# Patient Record
Sex: Male | Born: 1955 | Race: White | Hispanic: No | Marital: Married | Smoking: Never smoker
Health system: Southern US, Community
[De-identification: ages and names within clinical notes are randomized; demographics above are authoritative.]

## PROBLEM LIST (undated history)

## (undated) DIAGNOSIS — I2699 Other pulmonary embolism without acute cor pulmonale: Secondary | ICD-10-CM

## (undated) DIAGNOSIS — F1090 Alcohol use, unspecified, uncomplicated: Secondary | ICD-10-CM

## (undated) DIAGNOSIS — C9110 Chronic lymphocytic leukemia of B-cell type not having achieved remission: Secondary | ICD-10-CM

## (undated) DIAGNOSIS — I82409 Acute embolism and thrombosis of unspecified deep veins of unspecified lower extremity: Secondary | ICD-10-CM

## (undated) DIAGNOSIS — Z7289 Other problems related to lifestyle: Secondary | ICD-10-CM

## (undated) DIAGNOSIS — S83209A Unspecified tear of unspecified meniscus, current injury, unspecified knee, initial encounter: Secondary | ICD-10-CM

## (undated) DIAGNOSIS — I5022 Chronic systolic (congestive) heart failure: Secondary | ICD-10-CM

## (undated) DIAGNOSIS — I493 Ventricular premature depolarization: Secondary | ICD-10-CM

## (undated) DIAGNOSIS — I429 Cardiomyopathy, unspecified: Secondary | ICD-10-CM

## (undated) HISTORY — PX: OTHER SURGICAL HISTORY: SHX169

## (undated) HISTORY — PX: TONSILLECTOMY: SUR1361

## (undated) HISTORY — DX: Chronic systolic (congestive) heart failure: I50.22

---

## 1998-09-14 ENCOUNTER — Ambulatory Visit (HOSPITAL_BASED_OUTPATIENT_CLINIC_OR_DEPARTMENT_OTHER): Admission: RE | Admit: 1998-09-14 | Discharge: 1998-09-14 | Payer: Self-pay | Admitting: Surgery

## 2005-06-10 ENCOUNTER — Ambulatory Visit (HOSPITAL_BASED_OUTPATIENT_CLINIC_OR_DEPARTMENT_OTHER): Admission: RE | Admit: 2005-06-10 | Discharge: 2005-06-10 | Payer: Self-pay | Admitting: Surgery

## 2005-06-10 HISTORY — PX: OTHER SURGICAL HISTORY: SHX169

## 2008-02-21 ENCOUNTER — Encounter: Admission: RE | Admit: 2008-02-21 | Discharge: 2008-02-21 | Payer: Self-pay | Admitting: Surgery

## 2010-06-21 ENCOUNTER — Encounter: Payer: Self-pay | Admitting: Surgery

## 2010-10-15 NOTE — Op Note (Signed)
[name], [name]             ACCOUNT NO.:  [id number]   MEDICAL RECORD NO.:  [id number]          PATIENT TYPE:  AMB   LOCATION:  DSC                          FACILITY:  [facility]   PHYSICIAN:  Thornton Park. Daphine Deutscher, MD  DATE OF BIRTH:  26-Sep-1955   DATE OF PROCEDURE:  06/10/2005  DATE OF DISCHARGE:                                 OPERATIVE REPORT   PREOPERATIVE DIAGNOSIS:  Left inguinal hernia.   POSTOPERATIVE DIAGNOSIS:  Left indirect sliding hernia containing sigmoid  colon.   PROCEDURE:  Left inguinal herniorrhaphy with Ethicon hernia system mesh  patch.   SURGEON:  Thornton Park. Daphine Deutscher, MD   ANESTHESIA:  General by LMA, converted to endotracheal.   DESCRIPTION OF PROCEDURE:  Ross [name] is a 55 year old man taken to  room #6 and given general. The inguinal region was clipped and prepped with  Betadine and draped sterilely. A hash Camara was made in the skin for  subsequent skin closure, and a small oblique incision was made in the left  inguinal region. This was carried down to a very thick panniculus, about an  inch and a half in depth, to the external obliques -- which were then  exposed and incised along the fibers, exposing the cord. Owing to the depth  and narrowness of his pelvis, I was able to mobilize the cord with some  difficulty; put a Penrose around it and then dissected proximally where it  exited the internal ring. There was a massive amount of fat there, and I  separated fat hernia from a large fat hernia that appeared to contain a sac.  There was also a sac more medially, which I opened and that was limited.  However, the mid large hernia as I opened contained, on one wall, the  sigmoid colon. I did not injure that, but recognized it and then dissected  this entire sac free.  I then pushed it back up inside the ring. I used a  Ray-Tec sponge to dissect around the ring, and then I used Ethicon hernia  mesh system and cut the posterior portion significantly;  giving lateral  coverage but less medial coverage to prevent the bulking effect.  I then  placed it in the internal ring, and sutured it in three places with 0  Prolene to the inguinal ligament laterally and then medially. This was then  on the outside cut to go around the cord, and sutured medially to the  inguinal ligament-pubis junction, and then it was sutured to itself as it  came around the cord structures. There was adequate room for the cord  structures. It was then tucked beneath the external oblique laterally, and  the external oblique was closed over this. The area was injected with 0.5%  Marcaine. There was no bleeding. Vicryl 4-0 was used in the subcutaneous  tissue and then subcuticularly, and the skin was closed with Dermabond. The  patient was given Percocet 7.5/500 to take for pain.  He will be followed up  in the office in 2-3 weeks.      Thornton Park Daphine Deutscher, MD  Electronically Signed  MBM/MEDQ  D:  06/10/2005  T:  06/11/2005  Job:  [id number]

## 2011-05-18 ENCOUNTER — Other Ambulatory Visit: Payer: Self-pay | Admitting: Dermatology

## 2011-06-29 ENCOUNTER — Other Ambulatory Visit: Payer: Self-pay | Admitting: Dermatology

## 2011-07-19 ENCOUNTER — Other Ambulatory Visit: Payer: Self-pay | Admitting: Orthopedic Surgery

## 2011-07-19 ENCOUNTER — Encounter (HOSPITAL_BASED_OUTPATIENT_CLINIC_OR_DEPARTMENT_OTHER): Payer: Self-pay | Admitting: *Deleted

## 2011-07-19 NOTE — Progress Notes (Signed)
NPO AFTER MN. ARRIVES AT 1000. NEEDS HG AND EKG. 

## 2011-07-22 ENCOUNTER — Encounter (HOSPITAL_BASED_OUTPATIENT_CLINIC_OR_DEPARTMENT_OTHER)
Admission: RE | Disposition: A | Discharge status: Home / Self Care | Payer: Self-pay | Source: Ambulatory Visit | Attending: Orthopedic Surgery

## 2011-07-22 ENCOUNTER — Encounter (HOSPITAL_BASED_OUTPATIENT_CLINIC_OR_DEPARTMENT_OTHER): Payer: Self-pay | Admitting: *Deleted

## 2011-07-22 ENCOUNTER — Encounter (HOSPITAL_BASED_OUTPATIENT_CLINIC_OR_DEPARTMENT_OTHER): Payer: Self-pay | Admitting: Anesthesiology

## 2011-07-22 ENCOUNTER — Other Ambulatory Visit: Payer: Self-pay

## 2011-07-22 ENCOUNTER — Ambulatory Visit (HOSPITAL_BASED_OUTPATIENT_CLINIC_OR_DEPARTMENT_OTHER)
Admission: RE | Admit: 2011-07-22 | Discharge: 2011-07-22 | Disposition: A | Discharge status: Home / Self Care | Payer: BC Managed Care – PPO | Source: Ambulatory Visit | Attending: Orthopedic Surgery | Admitting: Orthopedic Surgery

## 2011-07-22 ENCOUNTER — Ambulatory Visit (HOSPITAL_BASED_OUTPATIENT_CLINIC_OR_DEPARTMENT_OTHER): Payer: BC Managed Care – PPO | Admitting: Anesthesiology

## 2011-07-22 DIAGNOSIS — M2240 Chondromalacia patellae, unspecified knee: Secondary | ICD-10-CM | POA: Insufficient documentation

## 2011-07-22 DIAGNOSIS — X58XXXA Exposure to other specified factors, initial encounter: Secondary | ICD-10-CM | POA: Insufficient documentation

## 2011-07-22 DIAGNOSIS — IMO0002 Reserved for concepts with insufficient information to code with codable children: Secondary | ICD-10-CM | POA: Insufficient documentation

## 2011-07-22 DIAGNOSIS — Z9889 Other specified postprocedural states: Secondary | ICD-10-CM

## 2011-07-22 HISTORY — PX: KNEE ARTHROSCOPY: SHX127

## 2011-07-22 HISTORY — DX: Unspecified tear of unspecified meniscus, current injury, unspecified knee, initial encounter: S83.209A

## 2011-07-22 SURGERY — ARTHROSCOPY, KNEE
Anesthesia: General | Site: Knee | Laterality: Left | Wound class: Clean

## 2011-07-22 MED ORDER — METHOCARBAMOL 500 MG PO TABS: 500.0000 mg | ORAL_TABLET | Freq: Four times a day (QID) | ORAL | Status: AC

## 2011-07-22 MED ORDER — LACTATED RINGERS IV SOLN
INTRAVENOUS | Status: DC | PRN
  Administered 2011-07-22 (×2): via INTRAVENOUS

## 2011-07-22 MED ORDER — POVIDONE-IODINE 7.5 % EX SOLN: Freq: Once | CUTANEOUS | Status: DC

## 2011-07-22 MED ORDER — FENTANYL CITRATE 0.05 MG/ML IJ SOLN
INTRAMUSCULAR | Status: DC | PRN
  Administered 2011-07-22: 100 ug via INTRAVENOUS
  Administered 2011-07-22 (×4): 50 ug via INTRAVENOUS

## 2011-07-22 MED ORDER — OXYCODONE-ACETAMINOPHEN 7.5-325 MG PO TABS: 1.0000 | ORAL_TABLET | ORAL | Status: AC | PRN

## 2011-07-22 MED ORDER — LIDOCAINE HCL (CARDIAC) 20 MG/ML IV SOLN
INTRAVENOUS | Status: DC | PRN
  Administered 2011-07-22: 100 mg via INTRAVENOUS

## 2011-07-22 MED ORDER — KETOROLAC TROMETHAMINE 30 MG/ML IJ SOLN: 15.0000 mg | Freq: Once | INTRAMUSCULAR | Status: DC | PRN

## 2011-07-22 MED ORDER — SODIUM CHLORIDE 0.9 % IR SOLN
Status: DC | PRN
  Administered 2011-07-22: 13:00:00

## 2011-07-22 MED ORDER — ONDANSETRON HCL 4 MG/2ML IJ SOLN
INTRAMUSCULAR | Status: DC | PRN
  Administered 2011-07-22: 4 mg via INTRAVENOUS

## 2011-07-22 MED ORDER — FENTANYL CITRATE 0.05 MG/ML IJ SOLN: 25.0000 ug | INTRAMUSCULAR | Status: DC | PRN

## 2011-07-22 MED ORDER — PROPOFOL 10 MG/ML IV EMUL
INTRAVENOUS | Status: DC | PRN
  Administered 2011-07-22: 350 mg via INTRAVENOUS
  Administered 2011-07-22: 50 mg via INTRAVENOUS

## 2011-07-22 MED ORDER — MIDAZOLAM HCL 5 MG/5ML IJ SOLN
INTRAMUSCULAR | Status: DC | PRN
  Administered 2011-07-22: 2 mg via INTRAVENOUS

## 2011-07-22 MED ORDER — DEXAMETHASONE SODIUM PHOSPHATE 4 MG/ML IJ SOLN
INTRAMUSCULAR | Status: DC | PRN
  Administered 2011-07-22: 10 mg via INTRAVENOUS

## 2011-07-22 MED ORDER — PROMETHAZINE HCL 25 MG/ML IJ SOLN: 6.2500 mg | INTRAMUSCULAR | Status: DC | PRN

## 2011-07-22 MED ORDER — SODIUM CHLORIDE 0.9 % IR SOLN
Status: DC | PRN
  Administered 2011-07-22: 6000 mL

## 2011-07-22 SURGICAL SUPPLY — 52 items
BANDAGE ELASTIC 6 VELCRO ST LF (GAUZE/BANDAGES/DRESSINGS) ×3 IMPLANT
BANDAGE ESMARK 6X9 LF (GAUZE/BANDAGES/DRESSINGS) ×1 IMPLANT
BANDAGE GAUZE ELAST BULKY 4 IN (GAUZE/BANDAGES/DRESSINGS) ×2 IMPLANT
BLADE 4.2CUDA (BLADE) IMPLANT
BLADE CUDA 5.5 (BLADE) IMPLANT
BLADE CUDA SHAVER 3.5 (BLADE) ×2 IMPLANT
BLADE CUTTER GATOR 3.5 (BLADE) IMPLANT
BLADE GREAT WHITE 4.2 (BLADE) IMPLANT
BNDG CMPR 9X6 STRL LF SNTH (GAUZE/BANDAGES/DRESSINGS) ×1
BNDG ESMARK 6X9 LF (GAUZE/BANDAGES/DRESSINGS) ×2
CANISTER SUCT LVC 12 LTR MEDI- (MISCELLANEOUS) ×3 IMPLANT
CANISTER SUCTION 1200CC (MISCELLANEOUS) ×2 IMPLANT
CLOTH BEACON ORANGE TIMEOUT ST (SAFETY) ×2 IMPLANT
DRAPE ARTHROSCOPY W/POUCH 114 (DRAPES) ×2 IMPLANT
DRAPE LG THREE QUARTER DISP (DRAPES) ×2 IMPLANT
DRSG EMULSION OIL 3X3 NADH (GAUZE/BANDAGES/DRESSINGS) ×2 IMPLANT
DURAPREP 26ML APPLICATOR (WOUND CARE) ×2 IMPLANT
ELECT MENISCUS 165MM 90D (ELECTRODE) IMPLANT
ELECT REM PT RETURN 9FT ADLT (ELECTROSURGICAL)
ELECTRODE REM PT RTRN 9FT ADLT (ELECTROSURGICAL) IMPLANT
GAUZE SPONGE 4X4 12PLY STRL LF (GAUZE/BANDAGES/DRESSINGS) ×1 IMPLANT
GLOVE BIOGEL PI IND STRL 6.5 (GLOVE) IMPLANT
GLOVE BIOGEL PI IND STRL 7.0 (GLOVE) IMPLANT
GLOVE BIOGEL PI INDICATOR 6.5 (GLOVE) ×3
GLOVE BIOGEL PI INDICATOR 7.0 (GLOVE) ×2
GLOVE ECLIPSE 7.0 STRL STRAW (GLOVE) ×1 IMPLANT
GLOVE ECLIPSE 8.0 STRL XLNG CF (GLOVE) ×3 IMPLANT
GLOVE INDICATOR 8.0 STRL GRN (GLOVE) ×2 IMPLANT
GOWN PREVENTION PLUS LG XLONG (DISPOSABLE) ×3 IMPLANT
GOWN STRL REIN XL XLG (GOWN DISPOSABLE) ×2 IMPLANT
IV NS IRRIG 3000ML ARTHROMATIC (IV SOLUTION) ×6 IMPLANT
KNEE WRAP E Z 3 GEL PACK (MISCELLANEOUS) ×2 IMPLANT
NDL HYPO 18GX1.5 BLUNT FILL (NEEDLE) ×1 IMPLANT
NDL SAFETY ECLIPSE 18X1.5 (NEEDLE) ×1 IMPLANT
NEEDLE HYPO 18GX1.5 BLUNT FILL (NEEDLE) ×2 IMPLANT
NEEDLE HYPO 18GX1.5 SHARP (NEEDLE) ×2
PACK ARTHROSCOPY DSU (CUSTOM PROCEDURE TRAY) ×2 IMPLANT
PACK BASIN DAY SURGERY FS (CUSTOM PROCEDURE TRAY) ×2 IMPLANT
PADDING CAST ABS 4INX4YD NS (CAST SUPPLIES) ×1
PADDING CAST ABS COTTON 4X4 ST (CAST SUPPLIES) ×1 IMPLANT
PENCIL BUTTON HOLSTER BLD 10FT (ELECTRODE) IMPLANT
SET ARTHROSCOPY TUBING (MISCELLANEOUS) ×2
SET ARTHROSCOPY TUBING LN (MISCELLANEOUS) ×1 IMPLANT
SPONGE GAUZE 4X4 12PLY (GAUZE/BANDAGES/DRESSINGS) ×2 IMPLANT
SUT ETHIBOND 2 OS 4 DA (SUTURE) IMPLANT
SUT ETHILON 4 0 PS 2 18 (SUTURE) ×2 IMPLANT
SUT VIC AB 0 CT1 36 (SUTURE) IMPLANT
SUT VIC AB 2-0 PS2 27 (SUTURE) IMPLANT
SYRINGE 10CC LL (SYRINGE) ×2 IMPLANT
TOWEL OR 17X24 6PK STRL BLUE (TOWEL DISPOSABLE) ×2 IMPLANT
WAND 90 DEG TURBOVAC W/CORD (SURGICAL WAND) IMPLANT
WATER STERILE IRR 500ML POUR (IV SOLUTION) ×2 IMPLANT

## 2011-07-22 NOTE — Anesthesia Preprocedure Evaluation (Signed)
Anesthesia Evaluation  Patient identified by MRN, date of birth, ID band Patient awake    Reviewed: Allergy & Precautions, H&P , NPO status , Patient's Chart, lab work & pertinent test results  Airway Mallampati: II TM Distance: >3 FB Neck ROM: Full    Dental No notable dental hx.    Pulmonary neg pulmonary ROS,  clear to auscultation  Pulmonary exam normal       Cardiovascular neg cardio ROS Regular Normal    Neuro/Psych Negative Neurological ROS  Negative Psych ROS   GI/Hepatic negative GI ROS, Neg liver ROS,   Endo/Other  Negative Endocrine ROS  Renal/GU negative Renal ROS  Genitourinary negative   Musculoskeletal negative musculoskeletal ROS (+)   Abdominal   Peds negative pediatric ROS (+)  Hematology negative hematology ROS (+)   Anesthesia Other Findings   Reproductive/Obstetrics negative OB ROS                           Anesthesia Physical Anesthesia Plan  ASA: I  Anesthesia Plan: General   Post-op Pain Management:    Induction: Intravenous  Airway Management Planned: LMA  Additional Equipment:   Intra-op Plan:   Post-operative Plan:   Informed Consent: I have reviewed the patients History and Physical, chart, labs and discussed the procedure including the risks, benefits and alternatives for the proposed anesthesia with the patient or authorized representative who has indicated his/her understanding and acceptance.   Dental advisory given  Plan Discussed with: CRNA  Anesthesia Plan Comments:         Anesthesia Quick Evaluation  

## 2011-07-22 NOTE — Brief Op Note (Signed)
07/22/2011  1:36 PM  PATIENT:  Ross [name]  56 y.o. 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  PROCEDURE:  Procedure(s) (LRB): ARTHROSCOPY KNEE (Left)with partial medial meniscectomy and shaving of patella  SURGEON:  Surgeon(s) and Role:    * Drucilla Schmidt, MD - Primary  PHYSICIAN ASSISTANT:   ASSISTANTS: nurse   ANESTHESIA:   general  EBL:  Total I/O In: 1000 [I.V.:1000] Out: -   BLOOD ADMINISTERED:none  DRAINS: none   LOCAL MEDICATIONS USED:  NONE  SPECIMEN:  No Specimen  DISPOSITION OF SPECIMEN:  N/A  COUNTS:  YES  TOURNIQUET:   Total Tourniquet Time Documented: Thigh (Left) - 45 minutes  DICTATION: .Other Dictation: Dictation Number [id number]  PLAN OF CARE: Discharge to home after PACU  PATIENT DISPOSITION:  PACU - hemodynamically stable.   Delay start of Pharmacological VTE agent (>24hrs) due to surgical blood loss or risk of bleeding: not applicable

## 2011-07-22 NOTE — Transfer of Care (Signed)
Immediate Anesthesia Transfer of Care Note  Patient: Ross [name]  Procedure(s) Performed: Procedure(s) (LRB): ARTHROSCOPY KNEE (Left)  Patient Location: PACU  Anesthesia Type: General  Level of Consciousness: awake, alert  and oriented  Airway & Oxygen Therapy: Patient Spontanous Breathing and Patient connected to face mask oxygen  Post-op Assessment: Report given to PACU RN and Post -op Vital signs reviewed and stable  Post vital signs: Reviewed and stable  Complications: No apparent anesthesia complications

## 2011-07-22 NOTE — Anesthesia Procedure Notes (Signed)
Procedure Name: LMA Insertion Date/Time: 07/22/2011 12:38 PM Performed by: Huel Coventry Pre-anesthesia Checklist: Patient identified, Emergency Drugs available, Suction available and Patient being monitored Patient Re-evaluated:Patient Re-evaluated prior to inductionOxygen Delivery Method: Circle System Utilized Preoxygenation: Pre-oxygenation with 100% oxygen Intubation Type: IV induction Ventilation: Mask ventilation without difficulty LMA: LMA inserted LMA Size: 5.0 Number of attempts: 1 Airway Equipment and Method: bite block Placement Confirmation: positive ETCO2 Tube secured with: Tape Dental Injury: Teeth and Oropharynx as per pre-operative assessment

## 2011-07-22 NOTE — H&P (Signed)
Ross [name] is an 56 y.o. male.   Chief Complaint: painful lt knee HPI: mri demonstrates medial meniscus tear  Past Medical History  Diagnosis Date  . Acute meniscal tear of knee LEFT    Past Surgical History  Procedure Date  . Left inguinal hernia repair w/ mesh 06-10-2005  . Fatty cyst removed from flank and back     FLANK- 15 YRS AGO/  BACK 12 YRS AGO    History reviewed. No pertinent family history. Social History:  reports that he has never smoked. He has never used smokeless tobacco. He reports that he drinks about 3.6 ounces of alcohol per week. He reports that he does not use illicit drugs.  Allergies: No Known Allergies  Medications Prior to Admission  Medication Dose Route Frequency Provider Last Rate Last Dose  . povidone-iodine (BETADINE) 7.5 % scrub   Topical Once Drucilla Schmidt, MD       No current outpatient prescriptions on file as of 07/22/2011.    Results for orders placed during the hospital encounter of 07/22/11 (from the past 48 hour(s))  POCT HEMOGLOBIN-HEMACUE     Status: Normal   Collection Time   07/22/11 10:41 AM      Component Value Range Comment   Hemoglobin 14.0  13.0 - 17.0 (g/dL)    No results found.  ROS  Blood pressure 149/84, pulse 91, temperature 97.7 F (36.5 C), temperature source Oral, resp. rate 18, SpO2 96.00%. Physical Exam  Constitutional: He is oriented to person, place, and time. He appears well-developed and well-nourished.  HENT:  Head: Normocephalic and atraumatic.  Right Ear: External ear normal.  Left Ear: External ear normal.  Nose: Nose normal.  Mouth/Throat: Oropharynx is clear and moist.  Eyes: Conjunctivae and EOM are normal. Pupils are equal, round, and reactive to light.  Neck: Normal range of motion. Neck supple.  Cardiovascular: Normal rate, regular rhythm, normal heart sounds and intact distal pulses.   Respiratory: Effort normal and breath sounds normal.  GI: Soft. Bowel sounds are normal.    Musculoskeletal: Normal range of motion.  Neurological: He is alert and oriented to person, place, and time. He has normal reflexes.  Skin: Skin is warm and dry.  Psychiatric: He has a normal mood and affect. His behavior is normal. Judgment and thought content normal.     Assessment/Plan Torn medial meniscus lt knee Lt knee arthroscopy with partial medial meniscectomy  Ross [name] P 07/22/2011, 12:17 PM

## 2011-07-22 NOTE — Anesthesia Postprocedure Evaluation (Signed)
  Anesthesia Post-op Note  Patient: Ross [name]  Procedure(s) Performed: Procedure(s) (LRB): ARTHROSCOPY KNEE (Left)  Patient Location: PACU  Anesthesia Type: General  Level of Consciousness: awake and alert   Airway and Oxygen Therapy: Patient Spontanous Breathing  Post-op Pain: mild  Post-op Assessment: Post-op Vital signs reviewed, Patient's Cardiovascular Status Stable, Respiratory Function Stable, Patent Airway and No signs of Nausea or vomiting  Post-op Vital Signs: stable  Complications: No apparent anesthesia complications

## 2011-07-25 ENCOUNTER — Encounter (HOSPITAL_BASED_OUTPATIENT_CLINIC_OR_DEPARTMENT_OTHER): Payer: Self-pay | Admitting: Orthopedic Surgery

## 2011-07-25 NOTE — Op Note (Signed)
Ross [name], RATHGEBER NO.:  [id number]  MEDICAL RECORD NO.:  [id number]  LOCATION:                               FACILITY:  [facility]  PHYSICIAN:  Marlowe Kays, M.D.  DATE OF BIRTH:  1955-06-23  DATE OF PROCEDURE:  07/22/2011 DATE OF DISCHARGE:                              OPERATIVE REPORT   PREOPERATIVE DIAGNOSIS:  Torn medial meniscus, left knee.  POSTOPERATIVE DIAGNOSES: 1. Torn medial meniscus. 2. Chondromalacia of patella, left knee.  OPERATION:  Left knee arthroscopy with, 1. Partial medial meniscectomy. 2. Shaving of patella.  SURGEON:  Marlowe Kays, M.D.  ASSISTANT:  Nurse.  ANESTHESIA:  General.  PATHOLOGY AND JUSTIFICATION FOR PROCEDURE:  He injured his left knee while getting up out of the chair and an MRI on July 06, 2011, demonstrated the preoperative diagnosis.  Accordingly, he is here today for the above-mentioned surgery.  PROCEDURE:  Satisfied general anesthesia, pneumatic tourniquet, the leg Esmarched out nonsterilely and tourniquet inflated 350 mmHg.  Thigh stabilizer applied.  Ace wrap and knee support to right lower extremity. Left leg was prepped with DuraPrep from stabilizer to ankle, draped in sterile field.  Time-out performed.  Superolateral saline inflow. First, through an anterolateral portal, the medial compartment of knee joint was evaluated.  He had minimal wear of the lateral portion of the medial femoral condyle in the intercondylar area.  The remainder of his joint surfaces looked quite good.  He did have a very comminuted tear, mainly of the posterior curve, but also slightly in the posterior 3rd of the medial meniscus, which I trimmed back to a stable rim with a combination of baskets and shaving down with 3.5 shaver.  Looking at the medial gutter and suprapatellar area, some wear of the midportion of patella, which I would rate as grade 2/4.  I shaved this down until smooth with a 3.5 shaver.  On reversing  portals, lateral compartment knee joint has minimal fraying of the lateral meniscus, but nothing that required shaving.  Knee joint was then irrigated to clear and all fluid possible removed.  I closed the 2 entry portals with 4-0 nylon and then injected 20 cc 0.5% Marcaine with adrenaline and 4 mg of morphine through the inflow apparatus, which I removed and this portal was closed with 4-0 nylon as well.  Betadine, Adaptic, dry sterile dressing were applied.  Tourniquet was released.  He tolerated the procedure well and was taken to Recovery in satisfactory condition with no known complications.          ______________________________ Marlowe Kays, M.D.     JA/MEDQ  D:  07/22/2011  T:  07/23/2011  Job:  [id number]

## 2011-07-29 ENCOUNTER — Encounter (HOSPITAL_BASED_OUTPATIENT_CLINIC_OR_DEPARTMENT_OTHER): Payer: Self-pay

## 2012-06-12 ENCOUNTER — Other Ambulatory Visit: Payer: Self-pay | Admitting: Dermatology

## 2014-07-31 ENCOUNTER — Inpatient Hospital Stay (HOSPITAL_COMMUNITY)
Admission: EM | Admit: 2014-07-31 | Discharge: 2014-08-04 | DRG: 176 | Disposition: A | Discharge status: Home / Self Care | Payer: Commercial Managed Care - PPO | Attending: Internal Medicine | Admitting: Internal Medicine

## 2014-07-31 ENCOUNTER — Encounter (HOSPITAL_COMMUNITY): Payer: Self-pay

## 2014-07-31 ENCOUNTER — Emergency Department (HOSPITAL_COMMUNITY): Payer: Commercial Managed Care - PPO

## 2014-07-31 DIAGNOSIS — I82402 Acute embolism and thrombosis of unspecified deep veins of left lower extremity: Secondary | ICD-10-CM

## 2014-07-31 DIAGNOSIS — D696 Thrombocytopenia, unspecified: Secondary | ICD-10-CM | POA: Diagnosis present

## 2014-07-31 DIAGNOSIS — R590 Localized enlarged lymph nodes: Secondary | ICD-10-CM | POA: Diagnosis present

## 2014-07-31 DIAGNOSIS — I8390 Asymptomatic varicose veins of unspecified lower extremity: Secondary | ICD-10-CM | POA: Diagnosis present

## 2014-07-31 DIAGNOSIS — R05 Cough: Secondary | ICD-10-CM | POA: Diagnosis present

## 2014-07-31 DIAGNOSIS — D72829 Elevated white blood cell count, unspecified: Secondary | ICD-10-CM | POA: Diagnosis present

## 2014-07-31 DIAGNOSIS — R61 Generalized hyperhidrosis: Secondary | ICD-10-CM | POA: Diagnosis present

## 2014-07-31 DIAGNOSIS — R0602 Shortness of breath: Secondary | ICD-10-CM | POA: Diagnosis not present

## 2014-07-31 DIAGNOSIS — I2699 Other pulmonary embolism without acute cor pulmonale: Principal | ICD-10-CM | POA: Diagnosis present

## 2014-07-31 DIAGNOSIS — I82412 Acute embolism and thrombosis of left femoral vein: Secondary | ICD-10-CM | POA: Diagnosis present

## 2014-07-31 DIAGNOSIS — R591 Generalized enlarged lymph nodes: Secondary | ICD-10-CM

## 2014-07-31 DIAGNOSIS — D739 Disease of spleen, unspecified: Secondary | ICD-10-CM | POA: Diagnosis present

## 2014-07-31 DIAGNOSIS — I82432 Acute embolism and thrombosis of left popliteal vein: Secondary | ICD-10-CM | POA: Diagnosis present

## 2014-07-31 DIAGNOSIS — R911 Solitary pulmonary nodule: Secondary | ICD-10-CM | POA: Diagnosis present

## 2014-07-31 DIAGNOSIS — I82409 Acute embolism and thrombosis of unspecified deep veins of unspecified lower extremity: Secondary | ICD-10-CM

## 2014-07-31 LAB — BASIC METABOLIC PANEL
ANION GAP: 8 (ref 5–15)
BUN: 8 mg/dL (ref 6–23)
CO2: 26 mmol/L (ref 19–32)
Calcium: 8.4 mg/dL (ref 8.4–10.5)
Chloride: 101 mmol/L (ref 96–112)
Creatinine, Ser: 0.77 mg/dL (ref 0.50–1.35)
GFR calc Af Amer: >90 mL/min (ref >90)
GFR calc non Af Amer: >90 mL/min (ref >90)
Glucose, Bld: 104 mg/dL — ABNORMAL HIGH (ref 70–99)
Potassium: 5.2 mmol/L — ABNORMAL HIGH (ref 3.5–5.1)
SODIUM: 135 mmol/L (ref 135–145)

## 2014-07-31 LAB — CBC
HEMATOCRIT: 45.1 % (ref 39.0–52.0)
Hemoglobin: 14.8 g/dL (ref 13.0–17.0)
MCH: 27.6 pg (ref 26.0–34.0)
MCHC: 32.8 g/dL (ref 30.0–36.0)
MCV: 84 fL (ref 78.0–100.0)
PLATELETS: 122 10*3/uL — AB (ref 150–400)
RBC: 5.37 MIL/uL (ref 4.22–5.81)
RDW: 15.8 % — ABNORMAL HIGH (ref 11.5–15.5)
WBC: 20.5 10*3/uL — AB (ref 4.0–10.5)

## 2014-07-31 LAB — PROTIME-INR
INR: 1.19 (ref 0.00–1.49)
PROTHROMBIN TIME: 15.2 s (ref 11.6–15.2)

## 2014-07-31 LAB — D-DIMER, QUANTITATIVE: D-Dimer, Quant: 7.27 ug/mL-FEU — ABNORMAL HIGH (ref 0.00–0.48)

## 2014-07-31 LAB — APTT: aPTT: 31 seconds (ref 24–37)

## 2014-07-31 LAB — I-STAT TROPONIN, ED: Troponin i, poc: 0.02 ng/mL (ref 0.00–0.08)

## 2014-07-31 LAB — HEPARIN LEVEL (UNFRACTIONATED): Heparin Unfractionated: <0.1 IU/mL — ABNORMAL LOW (ref 0.30–0.70)

## 2014-07-31 MED ORDER — HEPARIN (PORCINE) IN NACL 100-0.45 UNIT/ML-% IJ SOLN
2800.0000 [IU]/h | INTRAMUSCULAR | Status: DC
  Administered 2014-07-31: 1900 [IU]/h via INTRAVENOUS
  Administered 2014-08-01: 2300 [IU]/h via INTRAVENOUS
  Administered 2014-08-01: 2600 [IU]/h via INTRAVENOUS
  Filled 2014-07-31 (×6): qty 250

## 2014-07-31 MED ORDER — SODIUM CHLORIDE 0.9 % IJ SOLN
3.0000 mL | Freq: Two times a day (BID) | INTRAMUSCULAR | Status: DC
  Administered 2014-08-04: 3 mL via INTRAVENOUS

## 2014-07-31 MED ORDER — ACETAMINOPHEN 650 MG RE SUPP: 650.0000 mg | Freq: Four times a day (QID) | RECTAL | Status: DC | PRN

## 2014-07-31 MED ORDER — HEPARIN BOLUS VIA INFUSION
4000.0000 [IU] | Freq: Once | INTRAVENOUS | Status: AC
  Administered 2014-07-31: 4000 [IU] via INTRAVENOUS
  Filled 2014-07-31: qty 4000

## 2014-07-31 MED ORDER — IOHEXOL 350 MG/ML SOLN
100.0000 mL | Freq: Once | INTRAVENOUS | Status: AC | PRN
  Administered 2014-07-31: 100 mL via INTRAVENOUS

## 2014-07-31 MED ORDER — SODIUM CHLORIDE 0.9 % IV SOLN
1000.0000 mL | INTRAVENOUS | Status: DC
  Administered 2014-07-31: 1000 mL via INTRAVENOUS
  Administered 2014-08-02: 500 mL via INTRAVENOUS

## 2014-07-31 MED ORDER — ACETAMINOPHEN 325 MG PO TABS: 650.0000 mg | ORAL_TABLET | Freq: Four times a day (QID) | ORAL | Status: DC | PRN

## 2014-07-31 MED ORDER — HEPARIN BOLUS VIA INFUSION
6000.0000 [IU] | Freq: Once | INTRAVENOUS | Status: AC
  Administered 2014-07-31: 6000 [IU] via INTRAVENOUS
  Filled 2014-07-31: qty 6000

## 2014-07-31 NOTE — ED Notes (Signed)
Diet tray ordered 

## 2014-07-31 NOTE — ED Notes (Signed)
MD at bedside.-Dr. Knapp 

## 2014-07-31 NOTE — ED Notes (Signed)
Dr.Knapp at bedside  

## 2014-07-31 NOTE — Progress Notes (Addendum)
*  Preliminary Results* Bilateral lower extremity venous duplex completed. The leftt lower extremity is positive for deep vein thrombosis involving the right femoral, and popliteal veins. Unable to visualize bilateral posterior tibial and peroneal veins. There is no evidence of deep vein thrombosis involving the right lower extremity.  Preliminary results discussed with Dr.Knapp.  07/31/2014  Maudry Mayhew, RVT, RDCS, RDMS

## 2014-07-31 NOTE — ED Provider Notes (Signed)
CSN: [id number]     Arrival date & time 07/31/14  1117 History   First MD Initiated Contact with Patient 07/31/14 1134     Chief Complaint  Patient presents with  . Shortness of Breath    HPI Pt started with a cough on Tuesday and noticed that he felt a little sort of breath as the day progressed.  The symptoms have been waxing and waning over the last couple of days.  He has been having persistent ccough and drainage.  No fevers.  No chest pain but he does feel that his chest gets heavy when he gets short of breath.  It lastes 30 sec and improves with rest.  No hx of heart or lung disease.  No history of leg swelling but he has had some left calf pain recently.  No history of prior dvt. Past Medical History  Diagnosis Date  . Acute meniscal tear of knee LEFT   Past Surgical History  Procedure Laterality Date  . Left inguinal hernia repair w/ mesh  06-10-2005  . Fatty cyst removed from flank and back      FLANK- 15 YRS AGO/  BACK 12 YRS AGO  . Knee arthroscopy  07/22/2011    Procedure: ARTHROSCOPY KNEE;  Surgeon: Magnus Sinning, MD;  Location: [facility];  Service: Orthopedics;  Laterality: Left;  Partial medial menisectomy and shaving of the patella   No family history on file. History  Substance Use Topics  . Smoking status: Never Smoker   . Smokeless tobacco: Never Used  . Alcohol Use: 3.6 oz/week    6 Shots of liquor per week    Review of Systems  All other systems reviewed and are negative.     Allergies  Review of patient's allergies indicates no known allergies.  Home Medications   Prior to Admission medications   Not on File   BP 114/69 mmHg  Pulse 49  Temp(Src) 97.4 F (36.3 C) (Oral)  Resp 21  Ht 6\' 1"  (1.854 m)  Wt 280 lb (127.007 kg)  BMI 36.95 kg/m2  SpO2 95% Physical Exam  Constitutional: He appears well-developed and well-nourished. No distress.  HENT:  Head: Normocephalic and atraumatic.  Right Ear: External ear normal.  Left  Ear: External ear normal.  Eyes: Conjunctivae are normal. Right eye exhibits no discharge. Left eye exhibits no discharge. No scleral icterus.  Neck: Neck supple. No tracheal deviation present.  Cardiovascular: Normal rate, regular rhythm and intact distal pulses.   Pulmonary/Chest: Breath sounds normal. No stridor. No respiratory distress. He has no wheezes. He has no rales.  Abdominal: Soft. Bowel sounds are normal. He exhibits no distension. There is no tenderness. There is no rebound and no guarding.  Musculoskeletal: He exhibits edema and tenderness.  Mild ttp and edema left calf   Neurological: He is alert. He has normal strength. No cranial nerve deficit (no facial droop, extraocular movements intact, no slurred speech) or sensory deficit. He exhibits normal muscle tone. He displays no seizure activity. Coordination normal.  Skin: Skin is warm and dry. No rash noted.  Psychiatric: He has a normal mood and affect.  Nursing note and vitals reviewed.   ED Course  Procedures (including critical care time) CRITICAL CARE Performed by: JMEQA,STM Total critical care time: 35 Critical care time was exclusive of separately billable procedures and treating other patients. Critical care was necessary to treat or prevent imminent or life-threatening deterioration. Critical care was time spent personally by me on  the following activities: development of treatment plan with patient and/or surrogate as well as nursing, discussions with consultants, evaluation of patient's response to treatment, examination of patient, obtaining history from patient or surrogate, ordering and performing treatments and interventions, ordering and review of laboratory studies, ordering and review of radiographic studies, pulse oximetry and re-evaluation of patient's condition.  Labs Review Labs Reviewed  CBC - Abnormal; Notable for the following:    WBC 20.5 (*)    RDW 15.8 (*)    Platelets 122 (*)    All other  components within normal limits  BASIC METABOLIC PANEL - Abnormal; Notable for the following:    Potassium 5.2 (*)    Glucose, Bld 104 (*)    All other components within normal limits  D-DIMER, QUANTITATIVE - Abnormal; Notable for the following:    D-Dimer, Quant 7.27 (*)    All other components within normal limits  APTT  PROTIME-INR  HEPARIN LEVEL (UNFRACTIONATED)  I-STAT TROPOININ, ED  Randolm Idol, ED    Imaging Review Dg Chest 2 View  07/31/2014   CLINICAL DATA:  Cough and shortness of breath for 2 days  EXAM: CHEST  2 VIEW  COMPARISON:  None.  FINDINGS: There is no edema or consolidation. Heart is slightly enlarged with pulmonary vascularity within normal limits. No adenopathy. No bone lesions.  IMPRESSION: No edema or consolidation.  Heart slightly enlarged.   Electronically Signed   By: Lowella Grip III M.D.   On: 07/31/2014 12:40  *Preliminary Results* Bilateral lower extremity venous duplex completed. The leftt lower extremity is positive for deep vein thrombosis involving the right femoral, and popliteal veins. Unable to visualize bilateral posterior tibial and peroneal veins. There is no evidence of deep vein thrombosis involving the right lower extremity.  Preliminary results discussed with Dr.Gwyn Hieronymus.  07/31/2014  Maudry Mayhew, RVT, RDCS, RDMS    EKG Interpretation   Date/Time:  Thursday July 31 2014 11:24:23 EST Ventricular Rate:  111 PR Interval:  199 QRS Duration: 108 QT Interval:  361 QTC Calculation: 491 R Axis:   -41 Text Interpretation:  Sinus tachycardia Ventricular bigeminy , new since  prior tracing Borderline prolonged PR interval Left axis deviation Low  voltage, precordial leads Consider anterior infarct Borderline T  abnormalities, inferior leads Confirmed by Osmar Howton  MD-J, Shelvia Fojtik ([id number]) on  07/31/2014 11:30:34 AM     Medications  0.9 %  sodium chloride infusion (not administered)  heparin bolus via infusion 6,000 Units (not  administered)  heparin ADULT infusion 100 units/mL (25000 units/250 mL) (not administered)  iohexol (OMNIPAQUE) 350 MG/ML injection 100 mL (100 mLs Intravenous Contrast Given 07/31/14 1521)    MDM   Final diagnoses:  DVT (deep venous thrombosis), left  Pulmonary embolism    Vascular study shows a dvt of his lle.  He has had recent travel, air and prolonged motor vehicle.  Possible etiology for the DVT. Sx are concerning for PE.  CT angio ordered.  Heparin infusion ordered.  Pt remains otherwise stable except for mild tachycardia noted on the monitor.  CT scan confirms bilateral large PE.   Question right heart strain.  Heparin has been started.  Plan on admission for further treatment.  Pt remains comfortable.   Findings and plan discussed with patient.      Dorie Rank, MD 07/31/14 1556

## 2014-07-31 NOTE — ED Notes (Signed)
Patient transported to CT 

## 2014-07-31 NOTE — Progress Notes (Signed)
ANTICOAGULATION CONSULT NOTE - Initial Consult  Pharmacy Consult for Heparin  Indication: New DVT  No Known Allergies  Patient Measurements: Height: 6\' 1"  (185.4 cm) Weight: 280 lb (127.007 kg) IBW/kg (Calculated) : 79.9 Heparin Dosing Weight: 108 kg   Vital Signs: Temp: 97.4 F (36.3 C) (03/03 1126) Temp Source: Oral (03/03 1126) BP: 114/69 mmHg (03/03 1405) Pulse Rate: 49 (03/03 1405)  Labs:  Recent Labs  07/31/14 1257  HGB 14.8  HCT 45.1  PLT 122*  APTT 31  LABPROT 15.2  INR 1.19  CREATININE 0.77    Estimated Creatinine Clearance: 142.2 mL/min (by C-G formula based on Cr of 0.77).   Medical History: Past Medical History  Diagnosis Date  . Acute meniscal tear of knee LEFT    Medications:   (Not in a hospital admission)  Assessment: 36 YOM who presented with SOB with left leg swelling. Found to have new DVT. No history of VTE. Not on any anticoagulation prior to admission. No history of bleeding per patient. H/H and plt wnl.   Goal of Therapy:  Heparin level 0.3-0.7 units/ml Monitor platelets by anticoagulation protocol: Yes   Plan:  -Heparin 6000 units bolus followed by heparin infusion at 1900 units/hr  -F/u 6 hr HL -Monitor daily HL, CBC and s/s of bleeding   Albertina Parr, PharmD., BCPS Clinical Pharmacist Pager [phone]

## 2014-07-31 NOTE — Plan of Care (Signed)
Problem: Consults Goal: Diagnosis - Venous Thromboembolism (VTE) Choose a selection Outcome: Completed/Met Date Met:  07/31/14 DVT (Deep Vein Thrombosis)  Pulmonary Emboli

## 2014-07-31 NOTE — ED Notes (Signed)
Pt arrives [facility] from [facility] for exertional shortness of breath. Onset- [id number]. Patient denies chest pain, abdominal pain. Denies weight gain.

## 2014-07-31 NOTE — Progress Notes (Addendum)
ANTICOAGULATION CONSULT NOTE - Follow Up Consult  Pharmacy Consult for Heparin  Indication: DVT  No Known Allergies  Patient Measurements: Height: 6\' 1"  (185.4 cm) Weight: 280 lb (127.007 kg) IBW/kg (Calculated) : 79.9  Vital Signs: Temp: 97.9 F (36.6 C) (03/03 2000) Temp Source: Oral (03/03 2000) BP: 130/84 mmHg (03/03 2000) Pulse Rate: 101 (03/03 2000)  Labs:  Recent Labs  07/31/14 1257 07/31/14 2145  HGB 14.8  --   HCT 45.1  --   PLT 122*  --   APTT 31  --   LABPROT 15.2  --   INR 1.19  --   HEPARINUNFRC  --  <0.10*  CREATININE 0.77  --     Estimated Creatinine Clearance: 142.2 mL/min (by C-G formula based on Cr of 0.77).   Assessment: New onset DVT, first HL is undetectable, no infusion issues per RN.   Goal of Therapy:  Heparin level 0.3-0.7 units/ml Monitor platelets by anticoagulation protocol: Yes   Plan:  -Heparin 4000 units BOLUS -Increase heparin drip to 2300 units/hr -0600 HL -Daily CBC/HL -Monitor for bleeding -F/U start of oral AC  Narda Bonds 07/31/2014,11:08 PM  ====================== Addendum 4:59 AM HL 0.13 drawn 4 hours after bolus and rate increase, warrants another bolus/rate increase in setting of acute DVT -Heparin 2000 units BOLUS -Increase heparin drip to 2600 units/hr -1200 HL JLedford, PharmD

## 2014-07-31 NOTE — ED Notes (Signed)
Patient transported to X-ray 

## 2014-07-31 NOTE — H&P (Signed)
History and Physical  Ross [name] FBP:[id number] DOB: 01/06/1956 DOA: 07/31/2014  Referring physician: Dr. Hillard Danker in ED PCP: No primary care provider on file.   Chief Complaint: short of breath  HPI:  59 year old male with no significant past medical history who presented to the emergency department with increasing dyspnea on exertion without chest pain or other features. Evaluation revealed bilateral pulmonary embolus as well as left lower extremity DVT. He was started on heparin and referred for admission.  Patient travels a lot for work including car and plane travel. He has never had any history of blood clots that he is aware of nor family history of blood clots. He said some chronic left lower extremity pain from long-standing musculoskeletal issues as well as chronic left lower extremity edema and has had a history of surgery on the left knee. He has had some increased tightness in the left calf lately however. He has had no chest pain. He has however had dyspnea on exertion becomes short of breath with minimal activity after a few minutes, this immediately improves with rest.  In the emergency department afebrile, VSS, minimal hypoxia, SpO2 96% on 2L. BMP with K+ 5.2, troponin negative; ddimer 7.27, WBC 20.5, WBC 12.2. Preliminary lower extremities venous Doppler study positive for DVT. Chest x-ray no acute disease. CT angiogram of the chest positive for acute PE with evidence of right heart strain consistent with at least submassive PE. Multiple borderline enlarged and mildly enlarged mediastinal or hilar lymph nodes as well as bilateral axillary lymph nodes. Consider lymphoproliferative disorder.  Review of Systems:  Negative for fever, visual changes, sore throat, rash, new muscle aches, chest pain,  dysuria, bleeding, n/v/abdominal pain, weight loss.  Past Medical History  Diagnosis Date  . Acute meniscal tear of knee LEFT    Past Surgical History  Procedure Laterality Date    . Left inguinal hernia repair w/ mesh  06-10-2005  . Fatty cyst removed from flank and back      FLANK- 15 YRS AGO/  BACK 12 YRS AGO  . Knee arthroscopy  07/22/2011    Procedure: ARTHROSCOPY KNEE;  Surgeon: Ross Sinning, MD;  Location: [facility];  Service: Orthopedics;  Laterality: Left;  Partial medial menisectomy and shaving of the patella    Social History:  reports that he has never smoked. He has never used smokeless tobacco. He reports that he drinks about 3.6 oz of alcohol per week. He reports that he does not use illicit drugs.  No Known Allergies  Family History  Problem Relation Age of Onset  . Cancer Father     "in lymph nodes"     Prior to Admission medications   Not on File   Physical Exam: Filed Vitals:   07/31/14 1200 07/31/14 1300 07/31/14 1405 07/31/14 1550  BP: 124/85 116/70 114/69 142/88  Pulse: 56 65 49   Temp:      TempSrc:      Resp: 19 22 21 18   Height:      Weight:      SpO2: 96% 96% 95% 96%   General: Appears calm and comfortable Eyes: PERRL, normal lids, irises  ENT: grossly normal hearing, lips & tongue Neck: no LAD, masses or thyromegaly Cardiovascular: RRR, no m/r/g. No LE edema. Respiratory: CTA bilaterally, no w/r/r. Normal respiratory effort. Speaks in full sentences.  Abdomen: soft, ntnd Skin: no rash or induration seen  Musculoskeletal: grossly normal tone BUE/BLE Psychiatric: grossly normal mood and affect, speech  fluent and appropriate Neurologic: grossly non-focal.  Wt Readings from Last 3 Encounters:  07/31/14 127.007 kg (280 lb)    Labs on Admission:  Basic Metabolic Panel:  Recent Labs Lab 07/31/14 1257  NA 135  K 5.2*  CL 101  CO2 26  GLUCOSE 104*  BUN 8  CREATININE 0.77  CALCIUM 8.4    CBC:  Recent Labs Lab 07/31/14 1257  WBC 20.5*  HGB 14.8  HCT 45.1  MCV 84.0  PLT 122*     Recent Labs  07/31/14 1314  TROPIPOC 0.02    Radiological Exams on Admission: Dg Chest 2  View  07/31/2014   CLINICAL DATA:  Cough and shortness of breath for 2 days  EXAM: CHEST  2 VIEW  COMPARISON:  None.  FINDINGS: There is no edema or consolidation. Heart is slightly enlarged with pulmonary vascularity within normal limits. No adenopathy. No bone lesions.  IMPRESSION: No edema or consolidation.  Heart slightly enlarged.   Electronically Signed   By: Lowella Grip III M.D.   On: 07/31/2014 12:40   Ct Angio Chest Pe W/cm &/or Wo Cm  07/31/2014   CLINICAL DATA:  59 year old male with history of shortness of breath since 07/29/2014. Dry cough.  EXAM: CT ANGIOGRAPHY CHEST WITH CONTRAST  TECHNIQUE: Multidetector CT imaging of the chest was performed using the standard protocol during bolus administration of intravenous contrast. Multiplanar CT image reconstructions and MIPs were obtained to evaluate the vascular anatomy.  CONTRAST:  154mL OMNIPAQUE IOHEXOL 350 MG/ML SOLN  COMPARISON:  No priors.  FINDINGS: Mediastinum/Lymph Nodes: Multiple large filling defects in the pulmonary arterial tree bilaterally involving the distal main pulmonary arteries bilaterally, extending into lobar, segmental and subsegmental sized branches. The majority of these defects appear to be nonocclusive. Greatest burden of clot is in the lower lobes of the lungs bilaterally, with relative sparing of the left upper lobe. Pulmonic trunk is dilated measuring up to 4.2 cm in diameter. Right ventricular diameter estimated at 6.8 cm, while left ventricular diameter is estimated at 5.9 cm (RV/LV ratio of 1.15). Straightening of the interventricular septum, and right to left bowing of the interatrial septum are also indicative of elevated right heart pressures. There is no significant pericardial fluid, thickening or pericardial calcification. Multiple borderline enlarged and mildly enlarged mediastinal and bilateral hilar lymph nodes, measuring up to 13 mm in short axis in the lower right peritracheal nodal station. Small hiatal  hernia. Numerous borderline enlarged and mildly enlarged bilateral axillary lymph nodes, largest of which in the left axilla measures up to 1.2 cm in short axis. The majority of these axillary nodes appear to have normal fatty hila.  Lungs/Pleura: No consolidative airspace disease to suggest hemorrhage from frank pulmonary infarction. There are multiple small nodular opacities scattered throughout the lungs bilaterally, which are poorly evaluated on today's examination secondary to extensive respiratory motion. The largest of these measures up to 7 mm in the anterior aspect of the right upper lobe (image 55 of series 6). No pleural effusions.  Upper Abdomen: Diffuse low attenuation throughout the hepatic parenchyma, compatible with hepatic steatosis. 9 mm low attenuation lesion in segment 4B of the liver is incompletely characterized, but favored to represent a small cyst. Distended inferior vena cava.  Musculoskeletal/Soft Tissues: There are no aggressive appearing lytic or blastic lesions noted in the visualized portions of the skeleton.  Review of the MIP images confirms the above findings.  IMPRESSION: 1. Positive for acute PE with CT evidence of right heartstrain (  RV/LV Ratio = 1.15) consistent with at least submassive (intermediate risk)PE. The presence of right heart strain has been associated with anincreased risk of morbidity and mortality. Consultation with Pulmonary and Critical Care Medicine is recommended. 2. Multiple borderline enlarged and mildly enlarged mediastinal and hilar lymph nodes, as well as bilateral axillary lymph nodes. Clinical correlation for underlying lymphoproliferative disorder is suggested. 3. Hepatic steatosis. 4. Additional incidental findings, as above. Critical Value/emergent results were called by telephone at the time of interpretation on 07/31/2014 at 3:45 pm to Dr. Dorie Rank, who verbally acknowledged these results.   Electronically Signed   By: Vinnie Langton M.D.   On:  07/31/2014 15:47    EKG: Independently reviewed. Sinus rhythm with ventricular bigeminy.   Principal Problem:   PE (pulmonary embolism) Active Problems:   DVT, lower extremity   Thrombocytopenia   Leukocytosis   Assessment/Plan 1. Bilateral acute pulmonary embolism, with CT evidence suggestive of heart strain possibly provoked by recent air travel and prolonged motor vehicle travel. However multiple mildly enlarged mediastinal and hilar lymph nodes as well as bilateral axillary lymph nodes are concerning for the possibility of underlying lymphoproliferative disorder. Given his thrombocytopenia and leukocytosis consider further evaluation based on his clinical response and repeat CBC in the morning. 2. Left lower extremity DVT 3. Leukocytosis and thrombocytopenia of unclear etiology 4. Multiple borderline enlarged and mildly enlarged mediastinal and hilar lymph nodes as well as bilateral axillary lymph nodes. Consider biopsy to rule out underlying lymphoproliferative disorder. Per patient his father had some malignancy of lymph nodes   Plan admission to telemetry bed. Hemodynamics are stable, no hypoxia, no clinical features of heart strain. Discussed with critical care Dr. Chauncey Cruel. Sommer--recommends heparin alone.  Continue heparin, anticipate transition to oral anticoagulant 24-48 hours.  Compression hose left lower extremity  Repeat CBC in the morning  Code Status: full code DVT prophylaxis: on heparin Family Communication: none present Disposition Plan/Anticipated LOS: admit, 2-4 days  Time spent: 60 minutes  Murray Hodgkins, MD  [facility] Pager [phone] 07/31/2014, 3:58 PM

## 2014-07-31 NOTE — ED Notes (Signed)
Patient transported to Ultrasound 

## 2014-08-01 DIAGNOSIS — R918 Other nonspecific abnormal finding of lung field: Secondary | ICD-10-CM

## 2014-08-01 DIAGNOSIS — I2699 Other pulmonary embolism without acute cor pulmonale: Secondary | ICD-10-CM

## 2014-08-01 DIAGNOSIS — G459 Transient cerebral ischemic attack, unspecified: Secondary | ICD-10-CM

## 2014-08-01 DIAGNOSIS — D696 Thrombocytopenia, unspecified: Secondary | ICD-10-CM

## 2014-08-01 LAB — CBC WITH DIFFERENTIAL/PLATELET

## 2014-08-01 LAB — BASIC METABOLIC PANEL
ANION GAP: 10 (ref 5–15)
Anion gap: 8 (ref 5–15)
BUN: <5 mg/dL — ABNORMAL LOW (ref 6–23)
BUN: 7 mg/dL (ref 6–23)
CHLORIDE: 103 mmol/L (ref 96–112)
CO2: 27 mmol/L (ref 19–32)
CO2: 24 mmol/L (ref 19–32)
CREATININE: 0.9 mg/dL (ref 0.50–1.35)
Calcium: 8.6 mg/dL (ref 8.4–10.5)
Calcium: 8.4 mg/dL (ref 8.4–10.5)
Chloride: 104 mmol/L (ref 96–112)
Creatinine, Ser: 0.85 mg/dL (ref 0.50–1.35)
GFR calc Af Amer: >90 mL/min (ref >90)
GFR calc non Af Amer: >90 mL/min (ref >90)
Glucose, Bld: 104 mg/dL — ABNORMAL HIGH (ref 70–99)
Glucose, Bld: 132 mg/dL — ABNORMAL HIGH (ref 70–99)
Potassium: 4 mmol/L (ref 3.5–5.1)
Potassium: 3.6 mmol/L (ref 3.5–5.1)
SODIUM: 138 mmol/L (ref 135–145)
SODIUM: 138 mmol/L (ref 135–145)

## 2014-08-01 LAB — DIFFERENTIAL
BASOS PCT: 0 % (ref 0–1)
Basophils Absolute: 0 10*3/uL (ref 0.0–0.1)
Eosinophils Absolute: 0.1 10*3/uL (ref 0.0–0.7)
Eosinophils Relative: 1 % (ref 0–5)
LYMPHS ABS: 1.5 10*3/uL (ref 0.7–4.0)
Lymphocytes Relative: 35 % (ref 12–46)
MONO ABS: 0.8 10*3/uL (ref 0.1–1.0)
Monocytes Relative: 4 % (ref 3–12)
Neutro Abs: 2.9 10*3/uL (ref 1.7–7.7)
Neutrophils Relative %: 60 % (ref 43–77)

## 2014-08-01 LAB — TROPONIN I
Troponin I: <0.03 ng/mL (ref <0.031)
Troponin I: <0.03 ng/mL (ref <0.031)

## 2014-08-01 LAB — RETICULOCYTES
RBC.: 5.34 MIL/uL (ref 4.22–5.81)
RBC.: 5.24 MIL/uL (ref 4.22–5.81)
RETIC COUNT ABSOLUTE: 110 10*3/uL (ref 19.0–186.0)
RETIC CT PCT: 2.4 % (ref 0.4–3.1)
Retic Count, Absolute: 128.2 10*3/uL (ref 19.0–186.0)
Retic Ct Pct: 2.1 % (ref 0.4–3.1)

## 2014-08-01 LAB — CBC
HEMATOCRIT: 45.3 % (ref 39.0–52.0)
Hemoglobin: 15 g/dL (ref 13.0–17.0)
MCH: 28.1 pg (ref 26.0–34.0)
MCHC: 33.1 g/dL (ref 30.0–36.0)
MCV: 85 fL (ref 78.0–100.0)
PLATELETS: 141 10*3/uL — AB (ref 150–400)
RBC: 5.33 MIL/uL (ref 4.22–5.81)
RDW: 16.1 % — ABNORMAL HIGH (ref 11.5–15.5)
WBC: 23.2 10*3/uL — ABNORMAL HIGH (ref 4.0–10.5)

## 2014-08-01 LAB — HEPARIN LEVEL (UNFRACTIONATED)
HEPARIN UNFRACTIONATED: 0.13 [IU]/mL — AB (ref 0.30–0.70)
HEPARIN UNFRACTIONATED: 0.28 [IU]/mL — AB (ref 0.30–0.70)
HEPARIN UNFRACTIONATED: 0.28 [IU]/mL — AB (ref 0.30–0.70)

## 2014-08-01 LAB — SAVE SMEAR

## 2014-08-01 LAB — MAGNESIUM: MAGNESIUM: 2.1 mg/dL (ref 1.5–2.5)

## 2014-08-01 MED ORDER — LEVOFLOXACIN 750 MG PO TABS
750.0000 mg | ORAL_TABLET | Freq: Every day | ORAL | Status: DC
  Administered 2014-08-01: 750 mg via ORAL
  Filled 2014-08-01 (×2): qty 1

## 2014-08-01 MED ORDER — HEPARIN BOLUS VIA INFUSION
2000.0000 [IU] | Freq: Once | INTRAVENOUS | Status: AC
  Administered 2014-08-01: 2000 [IU] via INTRAVENOUS
  Filled 2014-08-01: qty 2000

## 2014-08-01 MED ORDER — HEPARIN BOLUS VIA INFUSION
2500.0000 [IU] | Freq: Once | INTRAVENOUS | Status: AC
  Administered 2014-08-01: 2500 [IU] via INTRAVENOUS
  Filled 2014-08-01: qty 2500

## 2014-08-01 MED ORDER — HEPARIN (PORCINE) IN NACL 100-0.45 UNIT/ML-% IJ SOLN
2900.0000 [IU]/h | INTRAMUSCULAR | Status: AC
  Administered 2014-08-01 – 2014-08-03 (×7): 3100 [IU]/h via INTRAVENOUS
  Filled 2014-08-01 (×10): qty 250

## 2014-08-01 NOTE — Progress Notes (Addendum)
ANTICOAGULATION CONSULT NOTE - Follow Up Consult  Pharmacy Consult for Heparin  Indication: DVT/PE  No Known Allergies  Patient Measurements: Height: 6\' 1"  (185.4 cm) Weight: 280 lb (127.007 kg) IBW/kg (Calculated) : 79.9  Vital Signs: Temp: 98.3 F (36.8 C) (03/04 0615) Temp Source: Oral (03/04 0615) BP: 119/79 mmHg (03/04 0615) Pulse Rate: 97 (03/04 0615)  Labs:  Recent Labs  07/31/14 1257 07/31/14 2145 08/01/14 0335 08/01/14 1150  HGB 14.8  --  15.0  --   HCT 45.1  --  45.3  --   PLT 122*  --  141*  --   APTT 31  --   --   --   LABPROT 15.2  --   --   --   INR 1.19  --   --   --   HEPARINUNFRC  --  <0.10* 0.13* 0.28*  CREATININE 0.77  --  0.85  --     Estimated Creatinine Clearance: 133.9 mL/min (by C-G formula based on Cr of 0.85).   Assessment: 59 YOM with new onset DVT/PE, CTA -  PE with CT evidence of right heart strain. LE doppler - Left femoral and left popliteal vein thrombosis.  He is on IV heparin. Heparin level 0.28 still slightly subtherapeutic. No issue with infusion, no bleeding noted per RN. Hgb stable, plt 141, mild thrombocytopenia at baseline. Hem/Onc following.  Goal of Therapy:  Heparin level 0.3-0.7 units/ml Monitor platelets by anticoagulation protocol: Yes   Plan:  -Increase heparin drip to 2800 units/hr -1900 HL -Daily CBC/HL -Monitor for bleeding -F/U start of oral AC  Maryanna Shape, PharmD, BCPS  Clinical Pharmacist  Pager: [phone]   08/01/2014,12:37 PM   Addendum:  HL still came back low even at 2800 units/hr. Going to give a small bolus and increase rate again.   Plan  Heparin bolus 2500 units x1 Heparin drip at  3100 units/hr F/u with level in AM  Onnie Boer, PharmD Pager: [phone] 08/01/2014 8:03 PM

## 2014-08-01 NOTE — Progress Notes (Signed)
UR COMPLETED  

## 2014-08-01 NOTE — Progress Notes (Signed)
Notified by CCMD of pt. Having 12 beat run of VT. Pt. States he was asleep and was startled by dietary entering room. Denies CP or SOB. Dr. Tyrell Antonio paged and made aware. Orders placed by MD. Will continue to closely monitor patient.  Ross [name], Arville Lime

## 2014-08-01 NOTE — Consult Note (Addendum)
PULMONARY/CCM CONSULT NOTE  Requesting MD/Service: [facility] Date of admission: 3/03 Date of consult: 3/04 Reason for consultation: PE   HPI:  Very pleasant 7 M with no significant PMH presented to his primary care physician and subsequently referred to [facility] ED for approx one month of L calf pain and mild swelling and 2 days of exertional dyspnea without CP, hemoptysis or fever. He also describes NP cough with onset at the same time as his DOE. His eval ultimately led to CTA chest which revealed PE. Subsequent evaluation has revealed moderate RV dilation. Pulm Med asked to assist in directing further eval and mgmt  Past Medical History  Diagnosis Date  . Acute meniscal tear of knee LEFT    MEDICATIONS: none PTA. Current meds reviewed  SH: never smoker. At baseline he is reasonably active with no significant cardiorespiratory limitations  Family History  Problem Relation Age of Onset  . Cancer Father     "in lymph nodes"    ROS - No recent long car or plane trips. No recent wt loss. No HEENT symptoms. No CP or palpitations. He reports a remote L achilles tendon injury with chronic pain. + LLE varicosities  Filed Vitals:   07/31/14 1724 07/31/14 2000 08/01/14 0615 08/01/14 1443  BP: 147/95 130/84 119/79 117/80  Pulse: 104 101 97 106  Temp: 97.8 F (36.6 C) 97.9 F (36.6 C) 98.3 F (36.8 C) 98.8 F (37.1 C)  TempSrc: Oral Oral Oral Oral  Resp: 18 18 18 18   Height: 6\' 1"  (1.854 m)     Weight: 127.007 kg (280 lb)     SpO2: 93% 96% 95% 97%    EXAM:  Gen: WDWN, NAD, pleasant HEENT: NCAT, WNL Neck: No JVD Lungs: Clear   Cardiovascular: Regular, no M Abdomen: Soft, NT, +BS Ext: increased L calf girth with mild pitting edema and LLE varicose veins Neuro: no focal deficts  DATA:  I have reviewed all of today's lab results. Relevant abnormalities are discussed in the A/P section  CTA chest: acute bilateral PE. Moderate clot burden. Several small bilateral nodules. Borderline,  nonspecific LAN  TTE: moderate RA and RV dilatation  LE duplex: L femoral and popliteal clot    IMPRESSION:   Acute DVT/PE - this does NOT appear to be provoked. His car trips to and from [place] are probably not sufficient to account for this. He probably has chronic LLE venous insufficiency (varicose veins) which might be related to a remote ankle injury as described above. Despite the TTE findings above, I do not think there is an indication for lytic therapy.   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 I have a low index of suspicion for metastatic cancer but they do warrant follow up  Lymphadenopathy - nonspecific. No further eval indicated @ present  PLAN/REC:  1) Agree with [facility] or [facility] for acute therapy 2) Would consider a hypercoag panel to eval for unprovoked VTE 3) No further eval of pulm nodules is indicated @ this time. Would consider repeat CT chest in 2-3 months, then one year 4) No further eval of lymphadenopathy other than follow up on CT scans as above 5) I see no indication for antibiotics - he certainly does not appear to have a resp tract infection. Suggest DC 6) Long term anticoagulation with warfarin or a NOAC. I suggest a NOAC as his diet includes lots of broccoli and spinach which will make successfully anticoagulating him with warfarin difficult 7) I have scheduled  oupt f/u with Dr Melvyn Novas 4/04 @ 10:15. At that time, consideration of duration of therapy (6 months vs lifelong) should be address and follow up for the pulmonary nodules should be arranged   Merton Border, MD ; [facility] service Mobile [phone].  After 5:30 PM or weekends, call [phone]   ADD: because of the findings on TTE and presence of residual LLE clot, I suggest monitoring in hospital through this WE. If no complications, he may be discharged as early as Monday 3/07  Merton Border, MD ; [facility] service Mobile [phone].  After 5:30 PM or weekends, call  [phone]

## 2014-08-01 NOTE — Consult Note (Signed)
INPATIENT HEMATOLOGY- ONCOLOGY CONSULTATION  No care team member to display  REASON FOR CONSULT: Pulmonary Embolism  Consulting Physician: Truitt Merle, MD  HISTORY OF PRESENTING ILLNESS:   Ross [name] 59 y.o. male admitted on 07/31/14 with increasing shortness of breath with minimal activity and chest pain following a recent trip about 3 weeks ago, as well as lower extremity edema, and acute on chronic left calf pain. Lower extremity dopplers were consistent with LLE DVT. CT angio of the chest on 3/3 was positive for acute submassive PE with evidence of right heart strain. In addition, multiple borderline enlarged and mildly enlarged mediastinal/ hilar and bilateral axillary nodes were noted.  The patient denies any fevers, but does have chronic night sweats, no chills. Has lost about 20 lbs intentionally over the last year. He denies pre-syncopal episodes, palpitations or hemoptysis. He denies any bleeding issues such as epistaxis, hematemesis, hematuria or hematochezia. He never had thrombotic events or prior PE/DVT history. 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 He denies any risk factors for HIV. He has never smoked. He denies taking  hormone replacement therapy. There is no family history of blood clots, but there is history of lymphoma in father. Patient travels frequently by plane and car due to work. He denies taking ASA or NSAIDs prior to admission. Patient states that has never had a hematological evaluation prior to this admission. Never had a bone marrow biopsy. His CBC is remarkable for leukocytosis and mild thrombocytopenia. Of note, prior CTs of the abdomen and pelvis in 2009 revealed incidental tiny bilateral lung nodules and a non specific tiny splenic lesion of unknown significance(see report below), patient was not aware of these findings. 2 D echo pending.   MEDICAL HISTORY:  Past Medical History  Diagnosis Date  . 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 Left inguinal hernia repair w/ mesh  06-10-2005  . Fatty cyst removed from flank and back      FLANK- 15 YRS AGO/  BACK 12 YRS AGO  . Knee arthroscopy  07/22/2011    Procedure: ARTHROSCOPY KNEE;  Surgeon: Magnus Sinning, MD;  Location: [facility];  Service: Orthopedics;  Laterality: Left;  Partial medial menisectomy and shaving of the patella    SOCIAL HISTORY: History   Social History  . Marital Status: Married    Spouse Name: N/A  . Number of Children: 4  . Years of Education: College   Occupational History  . Not on file.   Social History Main Topics  . Smoking status: Never Smoker   . Smokeless tobacco: Never Used  . Alcohol Use: 3.6 oz/week    6 Shots of liquor per week  . Drug Use: No  . Sexual Activity: Not on file   Other Topics Concern  . Not on file   Social History Narrative    FAMILY HISTORY: Family History  Problem Relation Age of Onset  . Cancer Father     "in lymph nodes"  No other history of malignancies in family  ALLERGIES:  has No Known Allergies.  MEDICATIONS:   Scheduled Meds: . levofloxacin  750 mg Oral Daily  . sodium chloride  3 mL Intravenous Q12H   Continuous Infusions: . sodium chloride 1,000 mL (07/31/14 1552)  . heparin 2,600  Units/hr (08/01/14 0511)   PRN Meds:.acetaminophen **OR** acetaminophen  ROS: Constitutional: Denies fevers, chills or abnormal night sweats Eyes: Denies blurriness of vision, double vision or watery eyes Ears, nose, mouth, throat, and face: Denies mucositis or sore throat Respiratory: Denies cough,or wheezes. Dyspnea as per HPI Cardiovascular: Denies palpitation, chest discomfort or lower extremity swelling Gastrointestinal:  Denies nausea, heartburn or change in bowel habits. He has lost 20 lbs by  dieting over the last year. Skin: Denies abnormal skin rashes Lymphatics: Denies new lymphadenopathy or easy bruising Neurological:Denies numbness, tingling or new weaknesses Musculoskeletal: Denies new myalgias but did have acute on chronic left calf pain on admission Behavioral/Psych: Mood is stable, no new changes  All other systems were reviewed with the patient and are negative.   Physical Exam   ECOG PERFORMANCE STATUS:1   Filed Vitals:   08/01/14 0615  BP: 119/79  Pulse: 97  Temp: 98.3 F (36.8 C)  Resp: 18   Filed Weights   07/31/14 1129 07/31/14 1724  Weight: 280 lb (127.007 kg) 280 lb (127.007 kg)    GENERAL:alert, no distress and comfortable SKIN: skin color, texture, turgor are normal, no rashes or significant lesions. Multiple areas of nevi.  EYES: normal, conjunctiva are pink and non-injected, sclera clear OROPHARYNX:no exudate, no erythema and lips, buccal mucosa, and tongue normal  NECK: supple, thyroid normal size, non-tender, without nodularity LYMPH:  no palpable lymphadenopathy in the cervical, axillary or inguinal LUNGS: clear to auscultation and percussion with normal breathing effort HEART: regular rate & rhythm and no murmurs and no lower extremity edema ABDOMEN:abdomen soft, non-tender and normal bowel sounds Musculoskeletal:no cyanosis of digits and no clubbing  PSYCH: alert & oriented x 3 with fluent speech NEURO: no focal motor/sensory deficits   LABORATORY DATA:    Recent Labs Lab 07/31/14 1257 08/01/14 0335  WBC 20.5* 23.2*  HGB 14.8 15.0  HCT 45.1 45.3  PLT 122* 141*  MCV 84.0 85.0  MCH 27.6 28.1  MCHC 32.8 33.1  RDW 15.8* 16.1*    Chemistries   Recent Labs Lab 07/31/14 1257 08/01/14 0335  NA 135 138  K 5.2* 4.0  CL 101 103  CO2 26 27  GLUCOSE 104* 104*  BUN 8 <5*  CREATININE 0.77 0.85  CALCIUM 8.4 8.6    Anemia panel:  No results for input(s): VITAMINB12, FOLATE, FERRITIN, TIBC, IRON, RETICCTPCT in the last 72  hours.  Coagulation profile  Recent Labs Lab 07/31/14 1257  INR 1.19    Urine Studies  No results for input(s): UHGB, CRYS in the last 72 hours.  Invalid input(s): UACOL, UAPR, USPG, UPH, UTP, UGL, UKET, UBIL, UNIT, UROB, ULEU, UEPI, UWBC, URBC, UBAC, CAST, UCOM, BILUA   Radiology Studies:      Dg Chest 2 View  07/31/2014   CLINICAL DATA:  Cough and shortness of breath for 2 days  EXAM: CHEST  2 VIEW  COMPARISON:  None.  FINDINGS: There is no edema or consolidation. Heart is slightly enlarged with pulmonary vascularity within normal limits. No adenopathy. No bone lesions.  IMPRESSION: No edema or consolidation.  Heart slightly enlarged.   Electronically Signed   By: Lowella Grip III M.D.   On: 07/31/2014 12:40   Ct Angio Chest Pe W/cm &/or Wo Cm  07/31/2014   CLINICAL DATA:  59 year old male with history of shortness of breath since 07/29/2014. Dry cough.  EXAM: CT ANGIOGRAPHY CHEST WITH CONTRAST  TECHNIQUE: Multidetector CT imaging of the chest was performed using the standard  protocol during bolus administration of intravenous contrast. Multiplanar CT image reconstructions and MIPs were obtained to evaluate the vascular anatomy.  CONTRAST:  152m OMNIPAQUE IOHEXOL 350 MG/ML SOLN  COMPARISON:  No priors.  FINDINGS: Mediastinum/Lymph Nodes: Multiple large filling defects in the pulmonary arterial tree bilaterally involving the distal main pulmonary arteries bilaterally, extending into lobar, segmental and subsegmental sized branches. The majority of these defects appear to be nonocclusive. Greatest burden of clot is in the lower lobes of the lungs bilaterally, with relative sparing of the left upper lobe. Pulmonic trunk is dilated measuring up to 4.2 cm in diameter. Right ventricular diameter estimated at 6.8 cm, while left ventricular diameter is estimated at 5.9 cm (RV/LV ratio of 1.15). Straightening of the interventricular septum, and right to left bowing of the interatrial septum are  also indicative of elevated right heart pressures. There is no significant pericardial fluid, thickening or pericardial calcification. Multiple borderline enlarged and mildly enlarged mediastinal and bilateral hilar lymph nodes, measuring up to 13 mm in short axis in the lower right peritracheal nodal station. Small hiatal hernia. Numerous borderline enlarged and mildly enlarged bilateral axillary lymph nodes, largest of which in the left axilla measures up to 1.2 cm in short axis. The majority of these axillary nodes appear to have normal fatty hila.  Lungs/Pleura: No consolidative airspace disease to suggest hemorrhage from frank pulmonary infarction. There are multiple small nodular opacities scattered throughout the lungs bilaterally, which are poorly evaluated on today's examination secondary to extensive respiratory motion. The largest of these measures up to 7 mm in the anterior aspect of the right upper lobe (image 55 of series 6). No pleural effusions.  Upper Abdomen: Diffuse low attenuation throughout the hepatic parenchyma, compatible with hepatic steatosis. 9 mm low attenuation lesion in segment 4B of the liver is incompletely characterized, but favored to represent a small cyst. Distended inferior vena cava.  Musculoskeletal/Soft Tissues: There are no aggressive appearing lytic or blastic lesions noted in the visualized portions of the skeleton.  Review of the MIP images confirms the above findings.  IMPRESSION: 1. Positive for acute PE with CT evidence of right heartstrain (RV/LV Ratio = 1.15) consistent with at least submassive (intermediate risk)PE. The presence of right heart strain has been associated with anincreased risk of morbidity and mortality. Consultation with Pulmonary and Critical Care Medicine is recommended. 2. Multiple borderline enlarged and mildly enlarged mediastinal and hilar lymph nodes, as well as bilateral axillary lymph nodes. Clinical correlation for underlying  lymphoproliferative disorder is suggested. 3. Hepatic steatosis. 4. Additional incidental findings, as above. Critical Value/emergent results were called by telephone at the time of interpretation on 07/31/2014 at 3:45 pm to Dr. JDorie Rank who verbally acknowledged these results.   Electronically Signed   By: DVinnie LangtonM.D.   On: 07/31/2014 15:47    LLE dopplers Summary 07/31/14:  - Findings consistent with deep vein thrombosis involving the leftfemoral vein and left popliteal vein. - No obvious evidence of deep vein thrombosis involving thevisualized veins of the right lower extremity. - No evidence of Baker&'s cyst on the right or left.  CT ABDOMEN (02/21/2008)  Findings: 2 mm right middle lobe lung nodule on image 2 of series 4. Lingular scar or atelectasis. 2 mm left lower lobe lung nodule on image 4.  Mild cardiomegaly. No pericardial or pleural effusion. Mild fatty infiltration of the liver. a too small to characterize lesion in segment 4 the liver is likely a cyst on image 26.  Subcapsular  indeterminate splenic lesion on image 30 measures approximately 8 mm. Splenule.   Normal stomach, pancreas, gallbladder, biliary tract, adrenal glands. Normal left kidney. Too small to characterize interpolar right renal lesion.  No retroperitoneal or retrocrural adenopathy. Normal colon, appendix, and terminal ileum.  Normal abdominal small bowel without ascites.  IMPRESSION:  1. No acute abdominal process. 2. Fatty infiltration of the liver. 3. Tiny bilateral lung nodules. If the patient is at high risk for bronchogenic carcinoma, follow-up chest CT at 1 year is recommended. This recommendation follows the consensus statement: 4. Nonspecific subcapsular splenic lesion. Most likely a small cyst or hemangioma/lymphangioma.  CT PELVIS  Findings: Normal pelvic bowel loops. No pelvic adenopathy.tiny left inguinal hernia contains only fat. Axial image 87  and coronal image 54. Normal urinary bladder and prostate. No ascites.No acute osseous abnormality.  IMPRESSION:  1. Tiny left inguinal hernia containing fat. 2. Otherwise no acute pelvic process.   ASSESSMENT/PLAN:    Left femoral and left popliteal DVT/ SubmassivePE:  Patient was admitted with dyspnea and left calf pain LLE dopplers was positive for DVT and CTA confirmed bilateral PE . In addition, bilateral mediastinal, hilar and axillary adenopathy was noted.  In conclusion, this appears to be provoked by recent travel and possible malignancy (father has lymphoma) Currently he is on  Heparin per Pharmacy.  his current anticoagulation therapy will interfere with some the tests and it is not possible to interpret the test results.Taking him off the anticoagulation therapy to do the tests may precipitate another thrombotic event.  Anticoagulation therapies would include  warfarin, low molecular weight heparin such as Lovenox or newer agents such as Rivaroxaban. Preventive measures such as avoiding hormonal supplement, avoiding cigarette smoking, keeping up-to-date with screening programs for early cancer detection, frequent ambulation for long distance travel and aggressive DVT prophylaxis in all surgical settings is recommended. Agree with  2 D echo to rule out thrombotic involvement.  An appointment will be arranged upon discharge. If needed preoperatively, he need any interruption of his anticoagulation therapy for elective procedures.  Bilateral mediastinal, hilar and axillary adenopathy Rule out reactive versus malignant in the setting of family history of lymphoma A CT of the abdomen and pelvis in 2009 had demonstrated incidental tiny bilateral pulmonary nodules of unknown significance.  No follow up proceeded. Consider repeat CT abdomen and pelvis while in hospital to rule out abdominal adenopathy and splenomegaly At some point he will require biopsy of most superficial  lymph node (?axillary) by IR for diagnosis.   Thrombocytopenia In the setting of possible bone marrow process and current anticoagulation.  Will continue to monitor No intervention is indicated at this time  Leukocytosis Likely reactive, however incidental adenopathy found in films and possible infection may play a role as well.  Will check a peripheral blood smear. He is on Levaquin coverage as per primary team   Full Code  **Disclaimer: This note was dictated with voice recognition software. Similar sounding words can inadvertently be transcribed and this note may contain transcription errors which may not have been corrected upon publication of note.**    WERTMAN,SARA E, PA-C @T @ 10:43 AM  Attending addendum: I have seen the patient, examined him. I agree with the assessment and and plan and have edited the notes.   59 yo male without significant PMH, presented with left calf pain for a few months, dyspnea for 3 days,  was found to have left calf DVT and bilateral pulmonary embolism.   1. Unprovoked DVT/PE -Due to  his current anticoagulation, I suggest limited hypercoagulation workup, including prothrombin gene mutation, factor V Leiden, and antithrombin III -I suggest low molecular weight heparin (Lovenox) 28m/kg q12h on discharge if no high co-pay issues, and consider switched to oral Coumadin or xarelto after a month, total anticoagulation duration for 6 months.  2. Leukocytosis -No clinical evidence of infection -I called lab to add on WBC differential, but I do not see a result. Please obtain a CBC with differential -Myeloproliferative neoplasm should be ruled out, in the setting of unprovoked DVT and PE. -I'll follow-up his CBC in 3-4 weeks, if it is still high, I'll obtain a mild proliferative disorder workup in my clinic  3. Mild thrombocytopenia -Likely related to his acute thrombosis -Follow-up her CBC closely.  4. I agree with Dr. SLeonidas Romberg s recommendation for  follow-up of his mediastinum adenopathy and no need for biopsy at this point..   I'll follow up him in my clinic in 3-4 weeks after his hospital discharge. I gave him in the contact information, we'll call him next week for his appointment.  FTruitt Merle

## 2014-08-01 NOTE — Progress Notes (Addendum)
[facility] PROGRESS NOTE  BARNES FLOREK BJS:[id number] DOB: 12-Sep-1955 DOA: 07/31/2014 PCP: No primary care provider on file.  Assessment/Plan: 1-Submassive PE; Left femoral and left Popliteal vein Thrombosis  ECHO pending if evidence of right side heart strain will consult Pulmonary.  Continue with heparin GTT.  Hematology consulted for anticoagulation recommendation, and further evaluation of mediastinal lymphadenopathy, and leukocytosis./  Vitals stable   2-Mediastinal Lymphadenopathy, leukocytosis; Hematology consulted.   3-Leukocytosis, productive cough. Will start Levaquin to cover for infectious process.   Code Status: Full Code.  Family Communication: care discussed with patient and wife who was at bedside.  Disposition Plan: Remain inpatient on heparin Gtt.    Consultants:  Hematology  Procedures:  Doppler; Findings consistent with deep vein thrombosis involving the left femoral vein and left popliteal vein.  ECHO; pending.  Antibiotics:  Levaquin 3-4  HPI/Subjective: He denies chest pain. Does relates productive cough, yellowish sputum.  Dyspnea on exertion.  he report night sweat for years, he usually sweats a lot.  He had 20 pound of weight loss, intentional.   h Objective: Filed Vitals:   08/01/14 0615  BP: 119/79  Pulse: 97  Temp: 98.3 F (36.8 C)  Resp: 18   No intake or output data in the 24 hours ending 08/01/14 0935 Filed Weights   07/31/14 1129 07/31/14 1724  Weight: 127.007 kg (280 lb) 127.007 kg (280 lb)    Exam:   General:  Alert in no distress.   Cardiovascular: S 1, S 2 RRR  Respiratory: CTA  Abdomen: Bs, present, soft, nt  Musculoskeletal: no edema.   Data Reviewed: Basic Metabolic Panel:  Recent Labs Lab 07/31/14 1257 08/01/14 0335  NA 135 138  K 5.2* 4.0  CL 101 103  CO2 26 27  GLUCOSE 104* 104*  BUN 8 <5*  CREATININE 0.77 0.85  CALCIUM 8.4 8.6   Liver Function Tests: No results for  input(s): AST, ALT, ALKPHOS, BILITOT, PROT, ALBUMIN in the last 168 hours. No results for input(s): LIPASE, AMYLASE in the last 168 hours. No results for input(s): AMMONIA in the last 168 hours. CBC:  Recent Labs Lab 07/31/14 1257 08/01/14 0335  WBC 20.5* 23.2*  HGB 14.8 15.0  HCT 45.1 45.3  MCV 84.0 85.0  PLT 122* 141*   Cardiac Enzymes: No results for input(s): CKTOTAL, CKMB, CKMBINDEX, TROPONINI in the last 168 hours. BNP (last 3 results) No results for input(s): BNP in the last 8760 hours.  ProBNP (last 3 results) No results for input(s): PROBNP in the last 8760 hours.  CBG: No results for input(s): GLUCAP in the last 168 hours.  No results found for this or any previous visit (from the past 240 hour(s)).   Studies: Dg Chest 2 View  07/31/2014   CLINICAL DATA:  Cough and shortness of breath for 2 days  EXAM: CHEST  2 VIEW  COMPARISON:  None.  FINDINGS: There is no edema or consolidation. Heart is slightly enlarged with pulmonary vascularity within normal limits. No adenopathy. No bone lesions.  IMPRESSION: No edema or consolidation.  Heart slightly enlarged.   Electronically Signed   By: Lowella Grip III M.D.   On: 07/31/2014 12:40   Ct Angio Chest Pe W/cm &/or Wo Cm  07/31/2014   CLINICAL DATA:  59 year old male with history of shortness of breath since 07/29/2014. Dry cough.  EXAM: CT ANGIOGRAPHY CHEST WITH CONTRAST  TECHNIQUE: Multidetector CT imaging of the chest was performed using the standard protocol during bolus administration of  intravenous contrast. Multiplanar CT image reconstructions and MIPs were obtained to evaluate the vascular anatomy.  CONTRAST:  152mL OMNIPAQUE IOHEXOL 350 MG/ML SOLN  COMPARISON:  No priors.  FINDINGS: Mediastinum/Lymph Nodes: Multiple large filling defects in the pulmonary arterial tree bilaterally involving the distal main pulmonary arteries bilaterally, extending into lobar, segmental and subsegmental sized branches. The majority of these  defects appear to be nonocclusive. Greatest burden of clot is in the lower lobes of the lungs bilaterally, with relative sparing of the left upper lobe. Pulmonic trunk is dilated measuring up to 4.2 cm in diameter. Right ventricular diameter estimated at 6.8 cm, while left ventricular diameter is estimated at 5.9 cm (RV/LV ratio of 1.15). Straightening of the interventricular septum, and right to left bowing of the interatrial septum are also indicative of elevated right heart pressures. There is no significant pericardial fluid, thickening or pericardial calcification. Multiple borderline enlarged and mildly enlarged mediastinal and bilateral hilar lymph nodes, measuring up to 13 mm in short axis in the lower right peritracheal nodal station. Small hiatal hernia. Numerous borderline enlarged and mildly enlarged bilateral axillary lymph nodes, largest of which in the left axilla measures up to 1.2 cm in short axis. The majority of these axillary nodes appear to have normal fatty hila.  Lungs/Pleura: No consolidative airspace disease to suggest hemorrhage from frank pulmonary infarction. There are multiple small nodular opacities scattered throughout the lungs bilaterally, which are poorly evaluated on today's examination secondary to extensive respiratory motion. The largest of these measures up to 7 mm in the anterior aspect of the right upper lobe (image 55 of series 6). No pleural effusions.  Upper Abdomen: Diffuse low attenuation throughout the hepatic parenchyma, compatible with hepatic steatosis. 9 mm low attenuation lesion in segment 4B of the liver is incompletely characterized, but favored to represent a small cyst. Distended inferior vena cava.  Musculoskeletal/Soft Tissues: There are no aggressive appearing lytic or blastic lesions noted in the visualized portions of the skeleton.  Review of the MIP images confirms the above findings.  IMPRESSION: 1. Positive for acute PE with CT evidence of right  heartstrain (RV/LV Ratio = 1.15) consistent with at least submassive (intermediate risk)PE. The presence of right heart strain has been associated with anincreased risk of morbidity and mortality. Consultation with Pulmonary and Critical Care Medicine is recommended. 2. Multiple borderline enlarged and mildly enlarged mediastinal and hilar lymph nodes, as well as bilateral axillary lymph nodes. Clinical correlation for underlying lymphoproliferative disorder is suggested. 3. Hepatic steatosis. 4. Additional incidental findings, as above. Critical Value/emergent results were called by telephone at the time of interpretation on 07/31/2014 at 3:45 pm to Dr. Dorie Rank, who verbally acknowledged these results.   Electronically Signed   By: Vinnie Langton M.D.   On: 07/31/2014 15:47    Scheduled Meds: . levofloxacin  750 mg Oral Daily  . sodium chloride  3 mL Intravenous Q12H   Continuous Infusions: . sodium chloride 1,000 mL (07/31/14 1552)  . heparin 2,600 Units/hr (08/01/14 0511)    Principal Problem:   PE (pulmonary embolism) Active Problems:   DVT, lower extremity   Thrombocytopenia   Leukocytosis   Bilateral pulmonary embolism    Time spent: 35 minutes.     Niel Hummer A  [facility] Pager [phone]. If 7PM-7AM, please contact night-coverage at [email or web address], password [facility] 08/01/2014, 9:35 AM  LOS: 1 day    ECHO with right side ventricular dysfunction. CCM consulted.

## 2014-08-01 NOTE — Progress Notes (Signed)
Echocardiogram 2D Echocardiogram has been performed.  Ross [name] 08/01/2014, 11:55 AM

## 2014-08-02 ENCOUNTER — Encounter (HOSPITAL_COMMUNITY): Payer: Self-pay

## 2014-08-02 ENCOUNTER — Inpatient Hospital Stay (HOSPITAL_COMMUNITY): Payer: Commercial Managed Care - PPO

## 2014-08-02 LAB — CBC
HEMATOCRIT: 45.2 % (ref 39.0–52.0)
HEMOGLOBIN: 14.8 g/dL (ref 13.0–17.0)
MCH: 28.1 pg (ref 26.0–34.0)
MCHC: 32.7 g/dL (ref 30.0–36.0)
MCV: 85.9 fL (ref 78.0–100.0)
PLATELETS: 129 10*3/uL — AB (ref 150–400)
RBC: 5.26 MIL/uL (ref 4.22–5.81)
RDW: 16.2 % — ABNORMAL HIGH (ref 11.5–15.5)
WBC: 19.8 10*3/uL — AB (ref 4.0–10.5)

## 2014-08-02 LAB — ANTITHROMBIN III: AntiThromb III Func: 65 % — ABNORMAL LOW (ref 75–120)

## 2014-08-02 LAB — HEPARIN LEVEL (UNFRACTIONATED): HEPARIN UNFRACTIONATED: 0.5 [IU]/mL (ref 0.30–0.70)

## 2014-08-02 LAB — TROPONIN I: Troponin I: <0.03 ng/mL (ref <0.031)

## 2014-08-02 MED ORDER — SODIUM CHLORIDE 0.9 % IV SOLN
INTRAVENOUS | Status: DC
  Administered 2014-08-02 – 2014-08-03 (×2): via INTRAVENOUS

## 2014-08-02 MED ORDER — IOHEXOL 300 MG/ML  SOLN
25.0000 mL | INTRAMUSCULAR | Status: AC
  Administered 2014-08-02 (×2): 25 mL via ORAL

## 2014-08-02 MED ORDER — IOHEXOL 300 MG/ML  SOLN
100.0000 mL | Freq: Once | INTRAMUSCULAR | Status: AC | PRN
  Administered 2014-08-02: 100 mL via INTRAVENOUS

## 2014-08-02 NOTE — Progress Notes (Signed)
Pt. Taken to CT scan via W/C

## 2014-08-02 NOTE — Progress Notes (Signed)
ANTICOAGULATION CONSULT NOTE - Follow Up Consult  Pharmacy Consult for Heparin  Indication: DVT/PE  No Known Allergies  Patient Measurements: Height: 6\' 1"  (185.4 cm) Weight: 280 lb (127.007 kg) IBW/kg (Calculated) : 79.9  Vital Signs: Temp: 97.7 F (36.5 C) (03/05 0509) Temp Source: Oral (03/05 0509) BP: 131/79 mmHg (03/05 0509) Pulse Rate: 92 (03/05 0509)  Labs:  Recent Labs  07/31/14 1257  08/01/14 0335 08/01/14 1150 08/01/14 1653 08/01/14 1830 08/01/14 2258 08/02/14 0515  HGB 14.8  --  15.0  --  PENDING  --   --  14.8  HCT 45.1  --  45.3  --  PENDING  --   --  45.2  PLT 122*  --  141*  --  PENDING  --   --  129*  APTT 31  --   --   --   --   --   --   --   LABPROT 15.2  --   --   --   --   --   --   --   INR 1.19  --   --   --   --   --   --   --   HEPARINUNFRC  --   < > 0.13* 0.28*  --  0.28*  --  0.50  CREATININE 0.77  --  0.85  --   --  0.90  --   --   TROPONINI  --   --   --   --   --  <0.03 <0.03 <0.03  < > = values in this interval not displayed.  Estimated Creatinine Clearance: 126.4 mL/min (by C-G formula based on Cr of 0.9).   Assessment: 3 YOM with new onset DVT/PE, CTA -  PE with CT evidence of right heart strain. LE doppler - Left femoral and left popliteal vein thrombosis.  He is on IV heparin. Heparin level 0.28 still slightly subtherapeutic. No issue with infusion, no bleeding noted per RN. Hgb stable, plt 141, mild thrombocytopenia at baseline. Hem/Onc following. Heparin level therapeutic 0.5 Goal of Therapy:  Heparin level 0.3-0.7 units/ml Monitor platelets by anticoagulation protocol: Yes   Plan:  -Heparin drip at 3100 units/hr, therapeutic continue current rate -Daily CBC/HL -Monitor for bleeding -F/U start of oral AC  Isac Sarna, BS Pharm D, BCPS Clinical Pharmacist   08/02/2014,11:54 AM

## 2014-08-02 NOTE — Progress Notes (Signed)
[facility] PROGRESS NOTE  Ross [name] YIR:[id number] DOB: 12/29/1955 DOA: 07/31/2014 PCP: No primary care provider on file.  Assessment/Plan: 1-Submassive PE; Left femoral and left Popliteal vein Thrombosis  ECHO with The cavity size was moderately dilated. Systolic function was mildly to moderately reduced.Right atrium: The atrium was moderately dilated. Continue with heparin GTT.  Plan to discharge on Lovenox for 1 month, to subsequently transition to Coumadin vs Xarelto. Need at least 6 month treatment. Needs to follow up with Dr Burr Medico.  CCM consulted, no need for Thrombolysis at this time. Continue with heparin Gtt over the weekend.  I have ordered Factor V, Prothrombin gene mutation, Antithrombin III.   2-Mediastinal Lymphadenopathy, leukocytosis; Hematology consulted.  Plan for work up outpatient.  Will check CT abdomen and pelvis to evaluate prior spleen lesion.   3-Leukocytosis, productive cough. Will stop Levaquin as recommended by pulmonology.   Code Status: Full Code.  Family Communication: care discussed with patient and wife who was at bedside.  Disposition Plan: Remain inpatient on heparin Gtt.    Consultants:  Hematology  Procedures:  Doppler; Findings consistent with deep vein thrombosis involving the left femoral vein and left popliteal vein.  ECHO; The cavity size was moderately dilated. Systolic function was mildly to moderately reduced.Right atrium: The atrium was moderately dilated.  Antibiotics:  Levaquin 3-4  HPI/Subjective: He is feeling ok, no chest pain.   h Objective: Filed Vitals:   08/02/14 0509  BP: 131/79  Pulse: 92  Temp: 97.7 F (36.5 C)  Resp: 18    Intake/Output Summary (Last 24 hours) at 08/02/14 1315 Last data filed at 08/02/14 0900  Gross per 24 hour  Intake    240 ml  Output      0 ml  Net    240 ml   Filed Weights   07/31/14 1129 07/31/14 1724  Weight: 127.007 kg (280 lb) 127.007 kg (280 lb)     Exam:   General:  Alert in no distress.   Cardiovascular: S 1, S 2 RRR  Respiratory: CTA  Abdomen: Bs, present, soft, nt  Musculoskeletal: no edema.   Data Reviewed: Basic Metabolic Panel:  Recent Labs Lab 07/31/14 1257 08/01/14 0335 08/01/14 1830  NA 135 138 138  K 5.2* 4.0 3.6  CL 101 103 104  CO2 26 27 24   GLUCOSE 104* 104* 132*  BUN 8 <5* 7  CREATININE 0.77 0.85 0.90  CALCIUM 8.4 8.6 8.4  MG  --   --  2.1   Liver Function Tests: No results for input(s): AST, ALT, ALKPHOS, BILITOT, PROT, ALBUMIN in the last 168 hours. No results for input(s): LIPASE, AMYLASE in the last 168 hours. No results for input(s): AMMONIA in the last 168 hours. CBC:  Recent Labs Lab 07/31/14 1257 08/01/14 0335 08/01/14 1653 08/01/14 1830 08/02/14 0515  WBC 20.5* 23.2* PENDING  --  19.8*  NEUTROABS  --   --  DUPLICATE REQUEST ADDED TO AM CBC 2.9  --   HGB 14.8 15.0 PENDING  --  14.8  HCT 45.1 45.3 PENDING  --  45.2  MCV 84.0 85.0 PENDING  --  85.9  PLT 122* 141* PENDING  --  129*   Cardiac Enzymes:  Recent Labs Lab 08/01/14 1830 08/01/14 2258 08/02/14 0515  TROPONINI <0.03 <0.03 <0.03   BNP (last 3 results) No results for input(s): BNP in the last 8760 hours.  ProBNP (last 3 results) No results for input(s): PROBNP in the last 8760 hours.  CBG: No  results for input(s): GLUCAP in the last 168 hours.  No results found for this or any previous visit (from the past 240 hour(s)).   Studies: Ct Angio Chest Pe W/cm &/or Wo Cm  07/31/2014   CLINICAL DATA:  59 year old male with history of shortness of breath since 07/29/2014. Dry cough.  EXAM: CT ANGIOGRAPHY CHEST WITH CONTRAST  TECHNIQUE: Multidetector CT imaging of the chest was performed using the standard protocol during bolus administration of intravenous contrast. Multiplanar CT image reconstructions and MIPs were obtained to evaluate the vascular anatomy.  CONTRAST:  148mL OMNIPAQUE IOHEXOL 350 MG/ML SOLN   COMPARISON:  No priors.  FINDINGS: Mediastinum/Lymph Nodes: Multiple large filling defects in the pulmonary arterial tree bilaterally involving the distal main pulmonary arteries bilaterally, extending into lobar, segmental and subsegmental sized branches. The majority of these defects appear to be nonocclusive. Greatest burden of clot is in the lower lobes of the lungs bilaterally, with relative sparing of the left upper lobe. Pulmonic trunk is dilated measuring up to 4.2 cm in diameter. Right ventricular diameter estimated at 6.8 cm, while left ventricular diameter is estimated at 5.9 cm (RV/LV ratio of 1.15). Straightening of the interventricular septum, and right to left bowing of the interatrial septum are also indicative of elevated right heart pressures. There is no significant pericardial fluid, thickening or pericardial calcification. Multiple borderline enlarged and mildly enlarged mediastinal and bilateral hilar lymph nodes, measuring up to 13 mm in short axis in the lower right peritracheal nodal station. Small hiatal hernia. Numerous borderline enlarged and mildly enlarged bilateral axillary lymph nodes, largest of which in the left axilla measures up to 1.2 cm in short axis. The majority of these axillary nodes appear to have normal fatty hila.  Lungs/Pleura: No consolidative airspace disease to suggest hemorrhage from frank pulmonary infarction. There are multiple small nodular opacities scattered throughout the lungs bilaterally, which are poorly evaluated on today's examination secondary to extensive respiratory motion. The largest of these measures up to 7 mm in the anterior aspect of the right upper lobe (image 55 of series 6). No pleural effusions.  Upper Abdomen: Diffuse low attenuation throughout the hepatic parenchyma, compatible with hepatic steatosis. 9 mm low attenuation lesion in segment 4B of the liver is incompletely characterized, but favored to represent a small cyst. Distended  inferior vena cava.  Musculoskeletal/Soft Tissues: There are no aggressive appearing lytic or blastic lesions noted in the visualized portions of the skeleton.  Review of the MIP images confirms the above findings.  IMPRESSION: 1. Positive for acute PE with CT evidence of right heartstrain (RV/LV Ratio = 1.15) consistent with at least submassive (intermediate risk)PE. The presence of right heart strain has been associated with anincreased risk of morbidity and mortality. Consultation with Pulmonary and Critical Care Medicine is recommended. 2. Multiple borderline enlarged and mildly enlarged mediastinal and hilar lymph nodes, as well as bilateral axillary lymph nodes. Clinical correlation for underlying lymphoproliferative disorder is suggested. 3. Hepatic steatosis. 4. Additional incidental findings, as above. Critical Value/emergent results were called by telephone at the time of interpretation on 07/31/2014 at 3:45 pm to Dr. Dorie Rank, who verbally acknowledged these results.   Electronically Signed   By: Vinnie Langton M.D.   On: 07/31/2014 15:47    Scheduled Meds: . sodium chloride  3 mL Intravenous Q12H   Continuous Infusions: . sodium chloride 500 mL (08/02/14 0414)  . sodium chloride    . heparin 3,100 Units/hr (08/02/14 0557)    Principal Problem:  PE (pulmonary embolism) Active Problems:   DVT, lower extremity   Thrombocytopenia   Leukocytosis   Bilateral pulmonary embolism    Time spent: 35 minutes.     Niel Hummer A  [facility] Pager [phone]. If 7PM-7AM, please contact night-coverage at [email or web address], password [facility] 08/02/2014, 1:15 PM  LOS: 2 days    ECHO with right side ventricular dysfunction. CCM consulted.

## 2014-08-02 NOTE — Progress Notes (Signed)
Pt. Now back in room

## 2014-08-03 LAB — HEPARIN LEVEL (UNFRACTIONATED): HEPARIN UNFRACTIONATED: 0.51 [IU]/mL (ref 0.30–0.70)

## 2014-08-03 LAB — CBC
HCT: 45.9 % (ref 39.0–52.0)
HEMOGLOBIN: 14.9 g/dL (ref 13.0–17.0)
MCH: 28 pg (ref 26.0–34.0)
MCHC: 32.5 g/dL (ref 30.0–36.0)
MCV: 86.3 fL (ref 78.0–100.0)
Platelets: 136 10*3/uL — ABNORMAL LOW (ref 150–400)
RBC: 5.32 MIL/uL (ref 4.22–5.81)
RDW: 16.1 % — ABNORMAL HIGH (ref 11.5–15.5)
WBC: 19.3 10*3/uL — ABNORMAL HIGH (ref 4.0–10.5)

## 2014-08-03 LAB — BASIC METABOLIC PANEL
Anion gap: 3 — ABNORMAL LOW (ref 5–15)
BUN: 6 mg/dL (ref 6–23)
CO2: 23 mmol/L (ref 19–32)
CREATININE: 0.62 mg/dL (ref 0.50–1.35)
Calcium: 8.2 mg/dL — ABNORMAL LOW (ref 8.4–10.5)
Chloride: 109 mmol/L (ref 96–112)
GFR calc non Af Amer: >90 mL/min (ref >90)
GLUCOSE: 108 mg/dL — AB (ref 70–99)
POTASSIUM: 4 mmol/L (ref 3.5–5.1)
SODIUM: 135 mmol/L (ref 135–145)

## 2014-08-03 LAB — GLUCOSE, CAPILLARY: GLUCOSE-CAPILLARY: 123 mg/dL — AB (ref 70–99)

## 2014-08-03 NOTE — Progress Notes (Signed)
[facility] PROGRESS NOTE  Ross [name] QIO:[id number] DOB: [birth year] DOA: 07/31/2014 PCP: No primary care provider on file.  Assessment/Plan: 1-Submassive PE; Left femoral and left Popliteal vein Thrombosis  ECHO with The cavity size was moderately dilated. Systolic function was mildly to moderately reduced.Right atrium: The atrium was moderately dilated. Continue with heparin GTT.  Plan to discharge on Lovenox for 1 month, to subsequently transition to Coumadin vs Xarelto. Need at least 6 month treatment. Needs to follow up with Dr Burr Medico.  CCM consulted, no need for Thrombolysis at this time. Continue with heparin Gtt over the weekend.  I have ordered Factor V, Prothrombin gene mutation pending.  Antithrombin III low 65.   Will transition to Lovenox on 3-6.   2-Mediastinal Lymphadenopathy, leukocytosis; Hematology consulted.  Plan for work up outpatient.  CT abdomen and pelvis: There are numerous mesenteric and retroperitoneal lymph nodes in the abdomen and pelvis. Many were present previously but all are enlarged when compared to the prior study, and there has been an increase in number as well. Lymphoproliferative disorder suspected and further investigation is warranted.  3-Leukocytosis, productive cough. Will stop Levaquin as recommended by pulmonology.   Code Status: Full Code.  Family Communication: care discussed with patient and wife who was at bedside.  Disposition Plan: Remain inpatient on heparin Gtt.    Consultants:  Hematology  Procedures:  Doppler; Findings consistent with deep vein thrombosis involving the left femoral vein and left popliteal vein.  ECHO; The cavity size was moderately dilated. Systolic function was mildly to moderately reduced.Right atrium: The atrium was moderately dilated.  Antibiotics:  Levaquin 3-4  HPI/Subjective: He is feeling ok, no chest pain. No dyspnea.  I have inform him all test results.   Objective: Filed  Vitals:   08/03/14 0337  BP: 126/80  Pulse: 60  Temp: 98.6 F (37 C)  Resp: 20    Intake/Output Summary (Last 24 hours) at 08/03/14 1432 Last data filed at 08/03/14 1300  Gross per 24 hour  Intake    584 ml  Output      0 ml  Net    584 ml   Filed Weights   07/31/14 1129 07/31/14 1724  Weight: 127.007 kg (280 lb) 127.007 kg (280 lb)    Exam:   General:  Alert in no distress.   Cardiovascular: S 1, S 2 RRR  Respiratory: CTA  Abdomen: Bs, present, soft, nt  Musculoskeletal: no edema.   Data Reviewed: Basic Metabolic Panel:  Recent Labs Lab 07/31/14 1257 08/01/14 0335 08/01/14 1830 08/03/14 0505  NA 135 138 138 135  K 5.2* 4.0 3.6 4.0  CL 101 103 104 109  CO2 26 27 24 23   GLUCOSE 104* 104* 132* 108*  BUN 8 <5* 7 6  CREATININE 0.77 0.85 0.90 0.62  CALCIUM 8.4 8.6 8.4 8.2*  MG  --   --  2.1  --    Liver Function Tests: No results for input(s): AST, ALT, ALKPHOS, BILITOT, PROT, ALBUMIN in the last 168 hours. No results for input(s): LIPASE, AMYLASE in the last 168 hours. No results for input(s): AMMONIA in the last 168 hours. CBC:  Recent Labs Lab 07/31/14 1257 08/01/14 0335 08/01/14 1653 08/01/14 1830 08/02/14 0515 08/03/14 0505  WBC 20.5* 23.2* PENDING  --  19.8* 19.3*  NEUTROABS  --   --  DUPLICATE REQUEST ADDED TO AM CBC 2.9  --   --   HGB 14.8 15.0 PENDING  --  14.8 14.9  HCT  45.1 45.3 PENDING  --  45.2 45.9  MCV 84.0 85.0 PENDING  --  85.9 86.3  PLT 122* 141* PENDING  --  129* 136*   Cardiac Enzymes:  Recent Labs Lab 08/01/14 1830 08/01/14 2258 08/02/14 0515  TROPONINI <0.03 <0.03 <0.03   BNP (last 3 results) No results for input(s): BNP in the last 8760 hours.  ProBNP (last 3 results) No results for input(s): PROBNP in the last 8760 hours.  CBG:  Recent Labs Lab 08/03/14 1226  GLUCAP 123*    No results found for this or any previous visit (from the past 240 hour(s)).   Studies: Ct Abdomen Pelvis W  Contrast  08/02/2014   CLINICAL DATA:  Foot evaluation for lymphadenopathy discovered in the mediastinum on 07/31/2014 CT scan, leukocytosis  EXAM: CT ABDOMEN AND PELVIS WITH CONTRAST  TECHNIQUE: Multidetector CT imaging of the abdomen and pelvis was performed using the standard protocol following bolus administration of intravenous contrast.  CONTRAST:  113mL OMNIPAQUE IOHEXOL 300 MG/ML  SOLN  COMPARISON:  07/31/2014, 02/21/2008  FINDINGS: Multiple small pulmonary nodules at the lung bases all measuring about 6 mm or smaller. Many of these were present in 2009 but are mildly larger.  Hepatic steatosis. 7 mm low-attenuation lesion left liver as described on 07/31/2014 CT scan too small to characterize but favored to be a cyst. Gallbladder contracted.  Spleen and splenules normal. No significant renal abnormalities. Sub cm cyst lower pole right kidney stable. Adrenal glands normal. Pancreas normal.  Aortoiliac vessels normal.  Bladder and reproductive organs normal.  There is adenopathy scattered throughout the abdomen and pelvis. There are numerous mesenteric lymph nodes measuring about 10-12 mm in short axis. There are retroperitoneal lymph nodes in the periaortic and aortocaval region measuring up to about a cm in diameter, increased when compared to 2009. There are bilateral iliac chain lymph nodes. On image number 55 near the bifurcation there is a lymph node on the left measuring 16 mm in short axis. On image number 60 there is a right iliac chain lymph node measuring 12 mm in short axis. Numerous other similarly sized lymph nodes throughout the iliac chains are identified. On image number 76 there is an external iliac lymph node measuring 23 mm, and there is a 23 mm external iliac chain lymph node on the right on image number 83.  Stomach, small bowel, and large bowel are normal. Mildly dilated inferior vena cava noted as described on prior or.  IMPRESSION: There are numerous mesenteric and retroperitoneal  lymph nodes in the abdomen and pelvis. Many were present previously but all are enlarged when compared to the prior study, and there has been an increase in number as well. Lymphoproliferative disorder suspected and further investigation is warranted.   Electronically Signed   By: Skipper Cliche M.D.   On: 08/02/2014 18:58    Scheduled Meds: . sodium chloride  3 mL Intravenous Q12H   Continuous Infusions: . sodium chloride 500 mL (08/02/14 0414)  . sodium chloride 75 mL/hr at 08/03/14 1030  . heparin 3,100 Units/hr (08/03/14 0911)    Principal Problem:   PE (pulmonary embolism) Active Problems:   DVT, lower extremity   Thrombocytopenia   Leukocytosis   Bilateral pulmonary embolism    Time spent: 35 minutes.     Niel Hummer A  [facility] Pager [phone]. If 7PM-7AM, please contact night-coverage at [email or web address], password [facility] 08/03/2014, 2:32 PM  LOS: 3 days

## 2014-08-03 NOTE — Progress Notes (Signed)
ANTICOAGULATION CONSULT NOTE - Follow Up Consult  Pharmacy Consult for Heparin  Indication: DVT/PE  No Known Allergies  Patient Measurements: Height: 6\' 1"  (185.4 cm) Weight: 280 lb (127.007 kg) IBW/kg (Calculated) : 79.9  Vital Signs: Temp: 98.6 F (37 C) (03/06 0337) Temp Source: Oral (03/06 0337) BP: 126/80 mmHg (03/06 0337) Pulse Rate: 60 (03/06 0337)  Labs:  Recent Labs  08/01/14 0335  08/01/14 1653 08/01/14 1830 08/01/14 2258 08/02/14 0515 08/03/14 0505  HGB 15.0  --  PENDING  --   --  14.8 14.9  HCT 45.3  --  PENDING  --   --  45.2 45.9  PLT 141*  --  PENDING  --   --  129* 136*  HEPARINUNFRC 0.13*  < >  --  0.28*  --  0.50 0.51  CREATININE 0.85  --   --  0.90  --   --  0.62  TROPONINI  --   --   --  <0.03 <0.03 <0.03  --   < > = values in this interval not displayed.  Estimated Creatinine Clearance: 142.2 mL/min (by C-G formula based on Cr of 0.62).   Assessment: 41 YOM with new onset DVT/PE, CTA -  PE with CT evidence of right heart strain. LE doppler - Left femoral and left popliteal vein thrombosis.  He is on IV heparin.  No issue with infusion, no bleeding noted per RN. Hgb stable, plt 141, mild thrombocytopenia at baseline. Hem/Onc following. Heparin level therapeutic 0.51 Goal of Therapy:  Heparin level 0.3-0.7 units/ml Monitor platelets by anticoagulation protocol: Yes   Plan:  -Heparin drip at 3100 units/hr, therapeutic continue current rate -Daily CBC/HL -Monitor for bleeding -F/U start of oral AC or lovenox  Isac Sarna, BS Pharm D, BCPS Clinical Pharmacist   08/03/2014,1:31 PM

## 2014-08-04 ENCOUNTER — Telehealth: Payer: Self-pay | Admitting: Hematology

## 2014-08-04 LAB — CBC
HCT: 47.3 % (ref 39.0–52.0)
Hemoglobin: 15.5 g/dL (ref 13.0–17.0)
MCH: 28.2 pg (ref 26.0–34.0)
MCHC: 32.8 g/dL (ref 30.0–36.0)
MCV: 86 fL (ref 78.0–100.0)
PLATELETS: 167 10*3/uL (ref 150–400)
RBC: 5.5 MIL/uL (ref 4.22–5.81)
RDW: 16.2 % — ABNORMAL HIGH (ref 11.5–15.5)
WBC: 22.6 10*3/uL — AB (ref 4.0–10.5)

## 2014-08-04 LAB — HEPARIN LEVEL (UNFRACTIONATED): HEPARIN UNFRACTIONATED: 0.9 [IU]/mL — AB (ref 0.30–0.70)

## 2014-08-04 MED ORDER — ENOXAPARIN SODIUM 150 MG/ML ~~LOC~~ SOLN
1.0000 mg/kg | Freq: Two times a day (BID) | SUBCUTANEOUS | Status: DC
Start: 2014-08-04 — End: 2015-03-18

## 2014-08-04 MED ORDER — ENOXAPARIN (LOVENOX) PATIENT EDUCATION KIT
PACK | Freq: Once | Status: AC
  Administered 2014-08-04: 1
  Filled 2014-08-04: qty 1

## 2014-08-04 MED ORDER — ENOXAPARIN SODIUM 150 MG/ML ~~LOC~~ SOLN
1.0000 mg/kg | Freq: Two times a day (BID) | SUBCUTANEOUS | Status: DC
  Administered 2014-08-04: 130 mg via SUBCUTANEOUS
  Filled 2014-08-04 (×2): qty 1

## 2014-08-04 NOTE — Progress Notes (Signed)
ANTICOAGULATION CONSULT NOTE - Follow Up Consult  Pharmacy Consult for Heparin  Indication: DVT  No Known Allergies  Patient Measurements: Height: 6\' 1"  (185.4 cm) Weight: 280 lb (127.007 kg) IBW/kg (Calculated) : 79.9  Vital Signs: Temp: 98 F (36.7 C) (03/07 0524) Temp Source: Oral (03/07 0524) BP: 127/95 mmHg (03/07 0524) Pulse Rate: 94 (03/07 0524)  Labs:  Recent Labs  08/01/14 1830 08/01/14 2258 08/02/14 0515 08/03/14 0505 08/04/14 0516  HGB  --   --  14.8 14.9 15.5  HCT  --   --  45.2 45.9 47.3  PLT  --   --  129* 136* 167  HEPARINUNFRC 0.28*  --  0.50 0.51 0.90*  CREATININE 0.90  --   --  0.62  --   TROPONINI <0.03 <0.03 <0.03  --   --     Estimated Creatinine Clearance: 142.2 mL/min (by C-G formula based on Cr of 0.62).   Assessment: Elevated heparin level, no issues per RN.   Goal of Therapy:  Heparin level 0.3-0.7 units/ml Monitor platelets by anticoagulation protocol: Yes   Plan:  -Decrease heparin drip to 2900 units/hr -1300 HL -Daily CBC/HL -Monitor for bleeding -F/U start of oral AC  Narda Bonds 08/04/2014,6:09 AM

## 2014-08-04 NOTE — Discharge Instructions (Signed)

## 2014-08-04 NOTE — Discharge Summary (Signed)
Physician Discharge Summary  Ross [name] YOV:[id number] DOB: 11/18/1955 DOA: 07/31/2014  PCP: No primary care provider on file.  Admit date: 07/31/2014 Discharge date: 08/04/2014  Time spent: 35 minutes  Recommendations for Outpatient Follow-up:  1. Follow up with PCP , need CBC to monitor count now that patient is on Lovenox.  2. Needs follow up with Dr Burr Medico for further work up of Mediastinal, abdominal lymphadenopathy.  3.   Discharge Diagnoses:    Sub-Massive PE (pulmonary embolism)   DVT, lower extremity   Mediastinal, abdominal, pelvis lymphadenopathy.    Lung nodule.    Thrombocytopenia   Leukocytosis   Bilateral pulmonary embolism   Discharge Condition: stable.   Diet recommendation: Heart Healthy  Filed Weights   07/31/14 1129 07/31/14 1724  Weight: 127.007 kg (280 lb) 127.007 kg (280 lb)    History of present illness:  59 year old male with no significant past medical history who presented to the emergency department with increasing dyspnea on exertion without chest pain or other features. Evaluation revealed bilateral pulmonary embolus as well as left lower extremity DVT. He was started on heparin and referred for admission.  Patient travels a lot for work including car and plane travel. He has never had any history of blood clots that he is aware of nor family history of blood clots. He said some chronic left lower extremity pain from long-standing musculoskeletal issues as well as chronic left lower extremity edema and has had a history of surgery on the left knee. He has had some increased tightness in the left calf lately however. He has had no chest pain. He has however had dyspnea on exertion becomes short of breath with minimal activity after a few minutes, this immediately improves with rest.  In the emergency department afebrile, VSS, minimal hypoxia, SpO2 96% on 2L. BMP with K+ 5.2, troponin negative; ddimer 7.27, WBC 20.5, WBC 12.2. Preliminary lower  extremities venous Doppler study positive for DVT. Chest x-ray no acute disease. CT angiogram of the chest positive for acute PE with evidence of right heart strain consistent with at least submassive PE. Multiple borderline enlarged and mildly enlarged mediastinal or hilar lymph nodes as well as bilateral axillary lymph nodes. Consider lymphoproliferative disorder.  Hospital Course:  -Submassive PE; Left femoral and left Popliteal vein Thrombosis  ECHO with The cavity size was moderately dilated. Systolic function was mildly to moderately reduced.Right atrium: The atrium was moderately dilated. Pulmonary recommend  heparin GTT for 3 days, over the weekend.  Plan to discharge on Lovenox for 1 month, to subsequently transition to Coumadin vs Xarelto. Need at least 6 month treatment. Needs to follow up with Dr Burr Medico.  CCM consulted, no need for Thrombolysis at this time. Continue with heparin Gtt over the weekend.  I have ordered Factor V, Prothrombin gene mutation pending. Antithrombin III low 65.  Transition to Lovenox on 3-6. Medical stable. Plan to discharge home today.   2-Mediastinal Lymphadenopathy, leukocytosis; Hematology consulted.  Plan for work up outpatient.  CT abdomen and pelvis: There are numerous mesenteric and retroperitoneal lymph nodes in the abdomen and pelvis. Many were present previously but all are enlarged when compared to the prior study, and there has been an increase in number as well. Lymphoproliferative disorder suspected and further investigation is warranted. Discussed Ct abdomen with Dr Burr Medico, plan to follow up with her in one month to arrange biopsy.   3-Leukocytosis, productive cough.  Levaquin discontinue as recommended by pulmonologist.  WBC fluctuate.   Procedures:  Doppler; Findings consistent with deep vein thrombosis involving the left femoral vein and left popliteal vein.  ECHO; The cavity size was moderately dilated. Systolic function was  mildly to moderately reduced.Right atrium: The atrium was moderately dilated.  Consultations:  Pulmonary  Oncology, Dr Burr Medico  Discharge Exam: Filed Vitals:   08/04/14 0524  BP: 127/95  Pulse: 94  Temp: 98 F (36.7 C)  Resp: 18    General: Alert in no distress.  Cardiovascular: S 1, S 2 RRR Respiratory: CTA  Discharge Instructions   Discharge Instructions    Diet - low sodium heart healthy    Complete by:  As directed      Increase activity slowly    Complete by:  As directed           Current Discharge Medication List    START taking these medications   Details  enoxaparin (LOVENOX) 150 MG/ML injection Inject 0.85 mLs (130 mg total) into the skin every 12 (twelve) hours. Qty: 60 Syringe, Refills: 1       No Known Allergies Follow-up Information    Follow up with Truitt Merle, MD In 4 weeks.   Specialties:  Hematology, Oncology   Contact information:   [place] [address] [phone]       Follow up with Christinia Gully, MD In 4 weeks.   Specialty:  Pulmonary Disease   Contact information:   [address] [address] [phone]       Follow up with  Melinda Crutch, MD In 1 week.   Specialty:  Family Medicine   Contact information:   [address] [place] [address] [phone]        The results of significant diagnostics from this hospitalization (including imaging, microbiology, ancillary and laboratory) are listed below for reference.    Significant Diagnostic Studies: Dg Chest 2 View  07/31/2014   CLINICAL DATA:  Cough and shortness of breath for 2 days  EXAM: CHEST  2 VIEW  COMPARISON:  None.  FINDINGS: There is no edema or consolidation. Heart is slightly enlarged with pulmonary vascularity within normal limits. No adenopathy. No bone lesions.  IMPRESSION: No edema or consolidation.  Heart slightly enlarged.   Electronically Signed   By: Lowella Grip III M.D.   On: 07/31/2014 12:40   Ct Angio Chest Pe  W/cm &/or Wo Cm  07/31/2014   CLINICAL DATA:  59 year old male with history of shortness of breath since 07/29/2014. Dry cough.  EXAM: CT ANGIOGRAPHY CHEST WITH CONTRAST  TECHNIQUE: Multidetector CT imaging of the chest was performed using the standard protocol during bolus administration of intravenous contrast. Multiplanar CT image reconstructions and MIPs were obtained to evaluate the vascular anatomy.  CONTRAST:  172mL OMNIPAQUE IOHEXOL 350 MG/ML SOLN  COMPARISON:  No priors.  FINDINGS: Mediastinum/Lymph Nodes: Multiple large filling defects in the pulmonary arterial tree bilaterally involving the distal main pulmonary arteries bilaterally, extending into lobar, segmental and subsegmental sized branches. The majority of these defects appear to be nonocclusive. Greatest burden of clot is in the lower lobes of the lungs bilaterally, with relative sparing of the left upper lobe. Pulmonic trunk is dilated measuring up to 4.2 cm in diameter. Right ventricular diameter estimated at 6.8 cm, while left ventricular diameter is estimated at 5.9 cm (RV/LV ratio of 1.15). Straightening of the interventricular septum, and right to left bowing of the interatrial septum are also indicative of elevated right heart pressures. There is no significant  pericardial fluid, thickening or pericardial calcification. Multiple borderline enlarged and mildly enlarged mediastinal and bilateral hilar lymph nodes, measuring up to 13 mm in short axis in the lower right peritracheal nodal station. Small hiatal hernia. Numerous borderline enlarged and mildly enlarged bilateral axillary lymph nodes, largest of which in the left axilla measures up to 1.2 cm in short axis. The majority of these axillary nodes appear to have normal fatty hila.  Lungs/Pleura: No consolidative airspace disease to suggest hemorrhage from frank pulmonary infarction. There are multiple small nodular opacities scattered throughout the lungs bilaterally, which are poorly  evaluated on today's examination secondary to extensive respiratory motion. The largest of these measures up to 7 mm in the anterior aspect of the right upper lobe (image 55 of series 6). No pleural effusions.  Upper Abdomen: Diffuse low attenuation throughout the hepatic parenchyma, compatible with hepatic steatosis. 9 mm low attenuation lesion in segment 4B of the liver is incompletely characterized, but favored to represent a small cyst. Distended inferior vena cava.  Musculoskeletal/Soft Tissues: There are no aggressive appearing lytic or blastic lesions noted in the visualized portions of the skeleton.  Review of the MIP images confirms the above findings.  IMPRESSION: 1. Positive for acute PE with CT evidence of right heartstrain (RV/LV Ratio = 1.15) consistent with at least submassive (intermediate risk)PE. The presence of right heart strain has been associated with anincreased risk of morbidity and mortality. Consultation with Pulmonary and Critical Care Medicine is recommended. 2. Multiple borderline enlarged and mildly enlarged mediastinal and hilar lymph nodes, as well as bilateral axillary lymph nodes. Clinical correlation for underlying lymphoproliferative disorder is suggested. 3. Hepatic steatosis. 4. Additional incidental findings, as above. Critical Value/emergent results were called by telephone at the time of interpretation on 07/31/2014 at 3:45 pm to Dr. Dorie Rank, who verbally acknowledged these results.   Electronically Signed   By: Vinnie Langton M.D.   On: 07/31/2014 15:47   Ct Abdomen Pelvis W Contrast  08/02/2014   CLINICAL DATA:  Foot evaluation for lymphadenopathy discovered in the mediastinum on 07/31/2014 CT scan, leukocytosis  EXAM: CT ABDOMEN AND PELVIS WITH CONTRAST  TECHNIQUE: Multidetector CT imaging of the abdomen and pelvis was performed using the standard protocol following bolus administration of intravenous contrast.  CONTRAST:  143mL OMNIPAQUE IOHEXOL 300 MG/ML  SOLN   COMPARISON:  07/31/2014, 02/21/2008  FINDINGS: Multiple small pulmonary nodules at the lung bases all measuring about 6 mm or smaller. Many of these were present in 2009 but are mildly larger.  Hepatic steatosis. 7 mm low-attenuation lesion left liver as described on 07/31/2014 CT scan too small to characterize but favored to be a cyst. Gallbladder contracted.  Spleen and splenules normal. No significant renal abnormalities. Sub cm cyst lower pole right kidney stable. Adrenal glands normal. Pancreas normal.  Aortoiliac vessels normal.  Bladder and reproductive organs normal.  There is adenopathy scattered throughout the abdomen and pelvis. There are numerous mesenteric lymph nodes measuring about 10-12 mm in short axis. There are retroperitoneal lymph nodes in the periaortic and aortocaval region measuring up to about a cm in diameter, increased when compared to 2009. There are bilateral iliac chain lymph nodes. On image number 55 near the bifurcation there is a lymph node on the left measuring 16 mm in short axis. On image number 60 there is a right iliac chain lymph node measuring 12 mm in short axis. Numerous other similarly sized lymph nodes throughout the iliac chains are identified. On image number  76 there is an external iliac lymph node measuring 23 mm, and there is a 23 mm external iliac chain lymph node on the right on image number 83.  Stomach, small bowel, and large bowel are normal. Mildly dilated inferior vena cava noted as described on prior or.  IMPRESSION: There are numerous mesenteric and retroperitoneal lymph nodes in the abdomen and pelvis. Many were present previously but all are enlarged when compared to the prior study, and there has been an increase in number as well. Lymphoproliferative disorder suspected and further investigation is warranted.   Electronically Signed   By: Skipper Cliche M.D.   On: 08/02/2014 18:58    Microbiology: No results found for this or any previous visit (from  the past 240 hour(s)).   Labs: Basic Metabolic Panel:  Recent Labs Lab 07/31/14 1257 08/01/14 0335 08/01/14 1830 08/03/14 0505  NA 135 138 138 135  K 5.2* 4.0 3.6 4.0  CL 101 103 104 109  CO2 26 27 24 23   GLUCOSE 104* 104* 132* 108*  BUN 8 <5* 7 6  CREATININE 0.77 0.85 0.90 0.62  CALCIUM 8.4 8.6 8.4 8.2*  MG  --   --  2.1  --    Liver Function Tests: No results for input(s): AST, ALT, ALKPHOS, BILITOT, PROT, ALBUMIN in the last 168 hours. No results for input(s): LIPASE, AMYLASE in the last 168 hours. No results for input(s): AMMONIA in the last 168 hours. CBC:  Recent Labs Lab 08/01/14 0335 08/01/14 1653 08/01/14 1830 08/02/14 0515 08/03/14 0505 08/04/14 0516  WBC 23.2* PENDING  --  19.8* 19.3* 22.6*  NEUTROABS  --  DUPLICATE REQUEST ADDED TO AM CBC 2.9  --   --   --   HGB 15.0 PENDING  --  14.8 14.9 15.5  HCT 45.3 PENDING  --  45.2 45.9 47.3  MCV 85.0 PENDING  --  85.9 86.3 86.0  PLT 141* PENDING  --  129* 136* 167   Cardiac Enzymes:  Recent Labs Lab 08/01/14 1830 08/01/14 2258 08/02/14 0515  TROPONINI <0.03 <0.03 <0.03   BNP: BNP (last 3 results) No results for input(s): BNP in the last 8760 hours.  ProBNP (last 3 results) No results for input(s): PROBNP in the last 8760 hours.  CBG:  Recent Labs Lab 08/03/14 1226  GLUCAP 123*       Signed:  Dalyn Becker A  [facility] 08/04/2014, 11:23 AM

## 2014-08-04 NOTE — Telephone Encounter (Signed)
Left pt vm in ref to Hospital f/u on 09/01/14@3 :00.  Asked to call and confirm appt.

## 2014-08-04 NOTE — Progress Notes (Addendum)
ANTICOAGULATION CONSULT NOTE - Follow Up Consult  Pharmacy Consult for Heparin >>>>> Lovenox Indication: DVT  No Known Allergies  Patient Measurements: Height: 6\' 1"  (185.4 cm) Weight: 280 lb (127.007 kg) IBW/kg (Calculated) : 79.9  Vital Signs: Temp: 98 F (36.7 C) (03/07 0524) Temp Source: Oral (03/07 0524) BP: 127/95 mmHg (03/07 0524) Pulse Rate: 94 (03/07 0524)  Labs:  Recent Labs  08/01/14 1830 08/01/14 2258 08/02/14 0515 08/03/14 0505 08/04/14 0516  HGB  --   --  14.8 14.9 15.5  HCT  --   --  45.2 45.9 47.3  PLT  --   --  129* 136* 167  HEPARINUNFRC 0.28*  --  0.50 0.51 0.90*  CREATININE 0.90  --   --  0.62  --   TROPONINI <0.03 <0.03 <0.03  --   --     Estimated Creatinine Clearance: 142.2 mL/min (by C-G formula based on Cr of 0.62).   Assessment: Switching to Lovenox  Goal of Therapy:  Monitor platelets by anticoagulation protocol: Yes   Plan:  -Lovenox 1 mg/kg subcutaneous q12h, then DC heparin after Lovenox administration  -MD note with plans for 1 month of Lovenox, then start oral AC   Mirra Basilio 08/04/2014,7:30 AM

## 2014-08-04 NOTE — Progress Notes (Signed)
CARE MANAGEMENT NOTE 08/04/2014  Patient:  Ross [name], Ross [name]   Account Number:  [id number]  Date Initiated:  08/04/2014  Documentation initiated by:  [facility]  Subjective/Objective Assessment:   PE, DVT     Action/Plan:   benefits check for Lovenox   Anticipated DC Date:  08/04/2014   Anticipated DC Plan:  [place]  CM consult  Medication Assistance      Choice offered to / List presented to:             Status of service:  Completed, signed off Medicare Important Message given?   (If response is "NO", the following Medicare IM given date fields will be blank) Date Medicare IM given:   Medicare IM given by:   Date Additional Medicare IM given:   Additional Medicare IM given by:    Discharge Disposition:  HOME/SELF CARE  Per UR Regulation:    If discussed at Long Length of Stay Meetings, dates discussed:    Comments:  08/04/2014 1140 Benefits check complete.  NCM spoke to pt and gave info on Lovenox and coverage. Info per [facility]. Jonnie Finner RN CCM Case Mgmt phone [phone]  S/W [facility] @ [facility] # [phone]   LOVENOX 130 MG  BID 30 DAYS SUPPLY  COVER-YES CO-PAY- $ 50.00 PRIOR APPROVAL - NO PHARMACY : ANY REATAIL  ENOXAPARIN 130 MG BID  30 DAY SUPPLY COVER- YES CO-PAY- $ 35.00 PRIRO APPROVAL- NO PHARMACY: ANY RETAIL   08/04/2014 1000 Pt scheduled to dc home on Lovenox. Will check benefits for medication. Jonnie Finner RN CCM Case Mgmt phone [phone]

## 2014-08-05 LAB — FACTOR 5 ASSAY: Factor V Activity: 85 % (ref 65–150)

## 2014-08-06 LAB — PROTHROMBIN GENE MUTATION

## 2014-08-28 DIAGNOSIS — M79609 Pain in unspecified limb: Secondary | ICD-10-CM | POA: Diagnosis not present

## 2014-08-28 DIAGNOSIS — M7989 Other specified soft tissue disorders: Secondary | ICD-10-CM | POA: Diagnosis not present

## 2014-09-01 ENCOUNTER — Ambulatory Visit (HOSPITAL_BASED_OUTPATIENT_CLINIC_OR_DEPARTMENT_OTHER): Payer: Commercial Managed Care - PPO | Admitting: Hematology

## 2014-09-01 ENCOUNTER — Encounter: Payer: Self-pay | Admitting: Internal Medicine

## 2014-09-01 ENCOUNTER — Encounter: Payer: Self-pay | Admitting: Hematology

## 2014-09-01 ENCOUNTER — Ambulatory Visit (INDEPENDENT_AMBULATORY_CARE_PROVIDER_SITE_OTHER): Payer: Commercial Managed Care - PPO | Admitting: Internal Medicine

## 2014-09-01 ENCOUNTER — Other Ambulatory Visit (HOSPITAL_BASED_OUTPATIENT_CLINIC_OR_DEPARTMENT_OTHER): Payer: Commercial Managed Care - PPO

## 2014-09-01 ENCOUNTER — Ambulatory Visit (HOSPITAL_BASED_OUTPATIENT_CLINIC_OR_DEPARTMENT_OTHER): Payer: Commercial Managed Care - PPO

## 2014-09-01 VITALS — BP 106/70 | HR 50 | Ht 73.0 in | Wt 285.0 lb

## 2014-09-01 DIAGNOSIS — D72829 Elevated white blood cell count, unspecified: Secondary | ICD-10-CM | POA: Diagnosis not present

## 2014-09-01 DIAGNOSIS — I82402 Acute embolism and thrombosis of unspecified deep veins of left lower extremity: Secondary | ICD-10-CM

## 2014-09-01 DIAGNOSIS — I2699 Other pulmonary embolism without acute cor pulmonale: Secondary | ICD-10-CM | POA: Diagnosis not present

## 2014-09-01 DIAGNOSIS — R599 Enlarged lymph nodes, unspecified: Secondary | ICD-10-CM | POA: Diagnosis not present

## 2014-09-01 DIAGNOSIS — D696 Thrombocytopenia, unspecified: Secondary | ICD-10-CM

## 2014-09-01 DIAGNOSIS — R590 Localized enlarged lymph nodes: Secondary | ICD-10-CM

## 2014-09-01 LAB — CBC & DIFF AND RETIC
BASO%: 0.1 % (ref 0.0–2.0)
Basophils Absolute: 0 10*3/uL (ref 0.0–0.1)
EOS ABS: 0 10*3/uL (ref 0.0–0.5)
EOS%: 0.2 % (ref 0.0–7.0)
HCT: 48.8 % (ref 38.4–49.9)
HGB: 16.3 g/dL (ref 13.0–17.1)
IMMATURE RETIC FRACT: 15.9 % — AB (ref 3.00–10.60)
LYMPH%: 78.9 % — ABNORMAL HIGH (ref 14.0–49.0)
MCH: 28.7 pg (ref 27.2–33.4)
MCHC: 33.4 g/dL (ref 32.0–36.0)
MCV: 85.9 fL (ref 79.3–98.0)
MONO#: 1.2 10*3/uL — ABNORMAL HIGH (ref 0.1–0.9)
MONO%: 5.7 % (ref 0.0–14.0)
NEUT%: 15.1 % — ABNORMAL LOW (ref 39.0–75.0)
NEUTROS ABS: 3.1 10*3/uL (ref 1.5–6.5)
Platelets: 109 10*3/uL — ABNORMAL LOW (ref 140–400)
RBC: 5.68 10*6/uL (ref 4.20–5.82)
RDW: 16.4 % — AB (ref 11.0–14.6)
RETIC CT ABS: 87.47 10*3/uL (ref 34.80–93.90)
Retic %: 1.54 % (ref 0.80–1.80)
WBC: 20.7 10*3/uL — AB (ref 4.0–10.3)
lymph#: 16.3 10*3/uL — ABNORMAL HIGH (ref 0.9–3.3)

## 2014-09-01 LAB — COMPREHENSIVE METABOLIC PANEL (CC13)
ALK PHOS: 60 U/L (ref 40–150)
ALT: 19 U/L (ref 0–55)
ANION GAP: 12 meq/L — AB (ref 3–11)
AST: 15 U/L (ref 5–34)
Albumin: 4 g/dL (ref 3.5–5.0)
BUN: 10.5 mg/dL (ref 7.0–26.0)
CO2: 23 meq/L (ref 22–29)
CREATININE: 0.8 mg/dL (ref 0.7–1.3)
Calcium: 9.1 mg/dL (ref 8.4–10.4)
Chloride: 106 mEq/L (ref 98–109)
GLUCOSE: 99 mg/dL (ref 70–140)
Potassium: 4.2 mEq/L (ref 3.5–5.1)
Sodium: 141 mEq/L (ref 136–145)
Total Bilirubin: 1.74 mg/dL — ABNORMAL HIGH (ref 0.20–1.20)
Total Protein: 6.6 g/dL (ref 6.4–8.3)

## 2014-09-01 LAB — TECHNOLOGIST REVIEW

## 2014-09-01 MED ORDER — RIVAROXABAN 20 MG PO TABS: 20.0000 mg | ORAL_TABLET | Freq: Every day | ORAL | Status: DC

## 2014-09-01 NOTE — Progress Notes (Signed)
Checked in new patient with no issues prior to seeing the dr. He has not traveled. °

## 2014-09-01 NOTE — Patient Instructions (Addendum)
The minimum for blood thinner is 6 months then reassess  Risk / benefit (assuming no lung symptoms and  normal venous dopplers and echocardiogram)  Ross [name] [facility] is great place for elastic hose in [place] at the K and W   There is no indication to biopsy your lung lymph nodes at this point   Resume normal activity and let us know if the breathing capacity doesn't normalize over time   Pulmonary follow up is as needed

## 2014-09-01 NOTE — Progress Notes (Signed)
Subjective:     Patient ID: Ross [name], male   DOB: 1955-10-10,    MRN: [id number]  HPI  75 yowm never smoker frequent trips to [facility] with chronic issues with L leg  dating back x 1995 last scope around 2011 admitted with PE to [place] date: 07/31/2014 Discharge date: 08/04/2014  Time spent: 35 minutes  Recommendations for Outpatient Follow-up:  1. Follow up with PCP , need CBC to monitor count now that patient is on Lovenox.  2. Needs follow up with Dr Burr Medico for further work up of Mediastinal, abdominal lymphadenopathy.  3.   Discharge Diagnoses:   Sub-Massive PE (pulmonary embolism)  DVT, lower extremity  Mediastinal, abdominal, pelvis lymphadenopathy.   Lung nodule.   Thrombocytopenia  Leukocytosis  Bilateral pulmonary embolism   Discharge Condition: stable.   Diet recommendation: Heart Healthy  Filed Weights   07/31/14 1129 07/31/14 1724  Weight: 127.007 kg (280 lb) 127.007 kg (280 lb)    History of present illness:  59 year old male with no significant past medical history who presented to the emergency department with increasing dyspnea on exertion without chest pain or other features. Evaluation revealed bilateral pulmonary embolus as well as left lower extremity DVT. He was started on heparin and referred for admission.  Patient travels a lot for work including car and plane travel. He has never had any history of blood clots that he is aware of nor family history of blood clots. He said some chronic left lower extremity pain from long-standing musculoskeletal issues as well as chronic left lower extremity edema and has had a history of surgery on the left knee. He has had some increased tightness in the left calf lately however. He has had no chest pain. He has however had dyspnea on exertion becomes short of breath with minimal activity after a few minutes, this immediately improves with rest.  In the emergency department  afebrile, VSS, minimal hypoxia, SpO2 96% on 2L. BMP with K+ 5.2, troponin negative; ddimer 7.27, WBC 20.5, WBC 12.2. Preliminary lower extremities venous Doppler study positive for DVT. Chest x-ray no acute disease. CT angiogram of the chest positive for acute PE with evidence of right heart strain consistent with at least submassive PE. Multiple borderline enlarged and mildly enlarged mediastinal or hilar lymph nodes as well as bilateral axillary lymph nodes. Consider lymphoproliferative disorder.  Hospital Course:  -Submassive PE; Left femoral and left Popliteal vein Thrombosis  ECHO with The cavity size was moderately dilated. Systolic function was mildly to moderately reduced.Right atrium: The atrium was moderately dilated. Pulmonary recommend heparin GTT for 3 days  Plan to discharge on Lovenox for 1 month, to subsequently transition to Coumadin vs Xarelto. Need at least 6 month treatment. Needs to follow up with Dr Burr Medico.  CCM consulted, no need for Thrombolysis at this time. Continue with heparin Gtt over the weekend.  I have ordered Factor V nl/ Antithrombin III low 65.  Transition to Lovenox on 3-6. Medical stable. Plan to discharge home today.   2-Mediastinal Lymphadenopathy, leukocytosis; Hematology consulted.  Plan for work up outpatient.  CT abdomen and pelvis: There are numerous mesenteric and retroperitoneal lymph nodes in the abdomen and pelvis. Many were present previously but all are enlarged when compared to the prior study, and there has been an increase in number as well. Lymphoproliferative disorder suspected and further investigation is warranted. Discussed Ct abdomen with Dr Burr Medico, plan to follow up with her in  one month to arrange biopsy.   3-Leukocytosis, productive cough. Levaquin discontinue as recommended by pulmonologist.  WBC fluctuate.   Procedures:  Doppler; Findings consistent with deep vein thrombosis involving the left femoral vein and left  popliteal vein.  ECHO; The cavity size was moderately dilated       09/01/2014 1st  Pulmonary office visit/ Ross [name]   Chief Complaint  Patient presents with  . HFU    Pt states that he is doing well since hospital d/c. His breathing is back at normal baseline and denies any new co's today.      Not limited by breathing from desired activities    No obvious day to day or daytime variabilty or assoc chronic cough or cp or chest tightness, subjective wheeze overt sinus or hb symptoms. No unusual exp hx or h/o childhood pna/ asthma or knowledge of premature birth.  Sleeping ok without nocturnal  or early am exacerbation  of respiratory  c/o's or need for noct saba. Also denies any obvious fluctuation of symptoms with weather or environmental changes or other aggravating or alleviating factors except as outlined above   Current Medications, Allergies, Complete Past Medical History, Past Surgical History, Family History, and Social History were reviewed in [employer] record.  ROS  The following are not active complaints unless bolded sore throat, dysphagia, dental problems, itching, sneezing,  nasal congestion or excess/ purulent secretions, ear ache,   fever, chills, sweats, unintended wt loss, pleuritic or exertional cp, hemoptysis,  orthopnea pnd or leg swelling, presyncope, palpitations, heartburn, abdominal pain, anorexia, nausea, vomiting, diarrhea  or change in bowel or urinary habits, change in stools or urine, dysuria,hematuria,  rash, arthralgias, visual complaints, headache, numbness weakness or ataxia or problems with walking or coordination,  change in mood/affect or memory.         Review of Systems     Objective:   Physical Exam    amb wm nad   Wt Readings from Last 3 Encounters:  09/01/14 285 lb (129.275 kg)  07/31/14 280 lb (127.007 kg)    Vital signs reviewed   HEENT: nl dentition, turbinates, and orophanx. Nl external ear canals without  cough reflex   NECK :  without JVD/Nodes/TM/ nl carotid upstrokes bilaterally   LUNGS: no acc muscle use, clear to A and P bilaterally without cough on insp or exp maneuvers   CV:  RRR  no s3 or murmur or increase in P2, no edema   ABD:  soft and nontender with nl excursion in the supine position. No bruits or organomegaly, bowel sounds nl  MS:  warm without deformities, calf tenderness, cyanosis or clubbing  SKIN: warm and dry without lesions    NEURO:  alert, approp, no deficits     CTa 08/10/14 Positive for acute PE with CT evidence of right heartstrain (RV/LV Ratio = 1.15) consistent with at least submassive (intermediate risk)PE. The presence of right heart strain has been associated with anincreased risk of morbidity and mortality. Consultation with Pulmonary and Critical Care Medicine is recommended. 2. Multiple borderline enlarged and mildly enlarged mediastinal and hilar lymph nodes, as well as bilateral axillary lymph nodes.  Assessment:

## 2014-09-05 ENCOUNTER — Telehealth: Payer: Self-pay | Admitting: Hematology

## 2014-09-05 NOTE — Telephone Encounter (Signed)
Called and confirmed appointment for 05/23 doopler. Patient will be out of town that day and will call vascular department to reschedule.

## 2014-09-07 ENCOUNTER — Encounter: Payer: Self-pay | Admitting: Internal Medicine

## 2014-09-07 ENCOUNTER — Encounter: Payer: Self-pay | Admitting: Hematology

## 2014-09-07 DIAGNOSIS — R590 Localized enlarged lymph nodes: Secondary | ICD-10-CM | POA: Insufficient documentation

## 2014-09-07 NOTE — Progress Notes (Signed)
[place]  Telephone:([phone]) [phone] Fax:([phone] [place] Note   Patient Care Team: Harle Battiest, MD as PCP - General (Obstetrics and Gynecology) 09/07/2014  CHIEF COMPLAINTS/PURPOSE OF CONSULTATION:  Pulmonary embolism and DVT   HISTORY OF PRESENTING ILLNESS:  Ross [name] 59 y.o. male is here to follow up his recently diagnosed a pulmonary embolism. I saw him initially in the hospital.  He was admitted on 07/31/14 with increasing shortness of breath with minimal activity and chest pain following a recent trip about 3 weeks ago, as well as lower extremity edema, and acute on chronic left calf pain. Lower extremity dopplers were consistent with LLE DVT. CT angio of the chest on 3/3 was positive for acute submassive PE with evidence of right heart strain. In addition, multiple borderline enlarged and mildly enlarged mediastinal/ hilar and bilateral axillary nodes were noted.   The patient denies any fevers, but does have chronic night sweats, no chills. Has lost about 20 lbs intentionally over the last year. He denies pre-syncopal episodes, palpitations or hemoptysis. He denies any bleeding issues such as epistaxis, hematemesis, hematuria or hematochezia. He never had thrombotic events or prior PE/DVT history. 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 He denies any risk factors for HIV. He has never smoked. He denies taking hormone replacement therapy. There is no family history of blood clots, but there is history of lymphoma in father. Patient travels frequently by plane and car due to work. He denies taking ASA or NSAIDs prior to admission. Patient states that has never had a hematological evaluation prior to this admission. Never had a bone marrow biopsy. His CBC is remarkable for leukocytosis and mild thrombocytopenia. Of note, prior CTs of the abdomen and pelvis in 2009 revealed incidental tiny bilateral  lung nodules and a non specific tiny splenic lesion of unknown significance(see report below), patient was not aware of these findings.   He was discharged home with Lovenox injection twice daily. He has been tolerating well, but does have bruises at the injection sites. No episodes of bleeding. He is doing very well, no more chest pain, shortness of breath resolved, left leg swelling also improved.   MEDICAL HISTORY:  Past Medical History  Diagnosis Date  . Acute meniscal tear of knee LEFT    SURGICAL HISTORY: Past Surgical History  Procedure Laterality Date  . Left inguinal hernia repair w/ mesh  06-10-2005  . Fatty cyst removed from flank and back      FLANK- 15 YRS AGO/  BACK 12 YRS AGO  . Knee arthroscopy  07/22/2011    Procedure: ARTHROSCOPY KNEE;  Surgeon: Magnus Sinning, MD;  Location: [facility];  Service: Orthopedics;  Laterality: Left;  Partial medial menisectomy and shaving of the patella    SOCIAL HISTORY: History   Social History  . Marital Status: Married    Spouse Name: N/A  . Number of Children: N/A  . Years of Education: N/A   Occupational History  . Not on file.   Social History Main Topics  . Smoking status: Never Smoker   . Smokeless tobacco: Never Used  . Alcohol Use: 3.6 oz/week    6 Shots of liquor per week  . Drug Use: No  . Sexual Activity: Not on file   Other Topics Concern  . Not on file   Social History Narrative    FAMILY HISTORY: Family History  Problem Relation Age  of Onset  . Cancer Father     "in lymph nodes"    ALLERGIES:  has No Known Allergies.  MEDICATIONS:  Current Outpatient Prescriptions  Medication Sig Dispense Refill  . enoxaparin (LOVENOX) 150 MG/ML injection Inject 0.85 mLs (130 mg total) into the skin every 12 (twelve) hours. 60 Syringe 1  . rivaroxaban (XARELTO) 20 MG TABS tablet Take 1 tablet (20 mg total) by mouth daily with supper. 30 tablet 3   No current facility-administered medications  for this visit.    REVIEW OF SYSTEMS:   Constitutional: Denies fevers, chills or abnormal night sweats Eyes: Denies blurriness of vision, double vision or watery eyes Ears, nose, mouth, throat, and face: Denies mucositis or sore throat Respiratory: Denies cough, dyspnea or wheezes Cardiovascular: Denies palpitation, chest discomfort or lower extremity swelling Gastrointestinal:  Denies nausea, heartburn or change in bowel habits Skin: Denies abnormal skin rashes Lymphatics: Denies new lymphadenopathy or easy bruising Neurological:Denies numbness, tingling or new weaknesses Behavioral/Psych: Mood is stable, no new changes  All other systems were reviewed with the patient and are negative.  PHYSICAL EXAMINATION: ECOG PERFORMANCE STATUS: 0 - Asymptomatic  Filed Vitals:   09/01/14 1513  BP: 126/82  Pulse: 55  Temp: 98.1 F (36.7 C)  Resp: 18   Filed Weights   09/01/14 1513  Weight: 289 lb 14.4 oz (131.498 kg)    GENERAL:alert, no distress and comfortable SKIN: skin color, texture, turgor are normal, no rashes or significant lesions EYES: normal, conjunctiva are pink and non-injected, sclera clear OROPHARYNX:no exudate, no erythema and lips, buccal mucosa, and tongue normal  NECK: supple, thyroid normal size, non-tender, without nodularity LYMPH:  no palpable lymphadenopathy in the cervical, axillary or inguinal LUNGS: clear to auscultation and percussion with normal breathing effort HEART: regular rate & rhythm and no murmurs and no lower extremity edema ABDOMEN:abdomen soft, non-tender and normal bowel sounds Musculoskeletal:no cyanosis of digits and no clubbing  PSYCH: alert & oriented x 3 with fluent speech NEURO: no focal motor/sensory deficits  LABORATORY DATA:  I have reviewed the data as listed Recent Results (from the past 2160 hour(s))  CBC     Status: Abnormal   Collection Time: 07/31/14 12:57 PM  Result Value Ref Range   WBC 20.5 (H) 4.0 - 10.5 K/uL   RBC  5.37 4.22 - 5.81 MIL/uL   Hemoglobin 14.8 13.0 - 17.0 g/dL   HCT 45.1 39.0 - 52.0 %   MCV 84.0 78.0 - 100.0 fL   MCH 27.6 26.0 - 34.0 pg   MCHC 32.8 30.0 - 36.0 g/dL   RDW 15.8 (H) 11.5 - 15.5 %   Platelets 122 (L) 150 - 400 K/uL  Basic metabolic panel     Status: Abnormal   Collection Time: 07/31/14 12:57 PM  Result Value Ref Range   Sodium 135 135 - 145 mmol/L   Potassium 5.2 (H) 3.5 - 5.1 mmol/L   Chloride 101 96 - 112 mmol/L   CO2 26 19 - 32 mmol/L   Glucose, Bld 104 (H) 70 - 99 mg/dL   BUN 8 6 - 23 mg/dL   Creatinine, Ser 0.77 0.50 - 1.35 mg/dL   Calcium 8.4 8.4 - 10.5 mg/dL   GFR calc non Af Amer >90 >90 mL/min   GFR calc Af Amer >90 >90 mL/min    Comment: (NOTE) The eGFR has been calculated using the CKD EPI equation. This calculation has not been validated in all clinical situations. eGFR's persistently <90 mL/min signify possible  Chronic Kidney Disease.    Anion gap 8 5 - 15  APTT     Status: None   Collection Time: 07/31/14 12:57 PM  Result Value Ref Range   aPTT 31 24 - 37 seconds  Protime-INR     Status: None   Collection Time: 07/31/14 12:57 PM  Result Value Ref Range   Prothrombin Time 15.2 11.6 - 15.2 seconds   INR 1.19 0.00 - 1.49  D-dimer, quantitative     Status: Abnormal   Collection Time: 07/31/14 12:57 PM  Result Value Ref Range   D-Dimer, Quant 7.27 (H) 0.00 - 0.48 ug/mL-FEU    Comment:        AT THE INHOUSE ESTABLISHED CUTOFF VALUE OF 0.48 ug/mL FEU, THIS ASSAY HAS BEEN DOCUMENTED IN THE LITERATURE TO HAVE A SENSITIVITY AND NEGATIVE PREDICTIVE VALUE OF AT LEAST 98 TO 99%.  THE TEST RESULT SHOULD BE CORRELATED WITH AN ASSESSMENT OF THE CLINICAL PROBABILITY OF DVT / VTE.   I-stat troponin, ED (not at [facility])     Status: None   Collection Time: 07/31/14  1:14 PM  Result Value Ref Range   Troponin i, poc 0.02 0.00 - 0.08 ng/mL   Comment 3            Comment: Due to the release kinetics of cTnI, a negative result within the first hours of the  onset of symptoms does not rule out myocardial infarction with certainty. If myocardial infarction is still suspected, repeat the test at appropriate intervals.   Heparin level (unfractionated)     Status: Abnormal   Collection Time: 07/31/14  9:45 PM  Result Value Ref Range   Heparin Unfractionated <0.10 (L) 0.30 - 0.70 IU/mL    Comment:        IF HEPARIN RESULTS ARE BELOW EXPECTED VALUES, AND PATIENT DOSAGE HAS BEEN CONFIRMED, SUGGEST FOLLOW UP TESTING OF ANTITHROMBIN III LEVELS.   Basic metabolic panel     Status: Abnormal   Collection Time: 08/01/14  3:35 AM  Result Value Ref Range   Sodium 138 135 - 145 mmol/L   Potassium 4.0 3.5 - 5.1 mmol/L    Comment: DELTA CHECK NOTED   Chloride 103 96 - 112 mmol/L   CO2 27 19 - 32 mmol/L   Glucose, Bld 104 (H) 70 - 99 mg/dL   BUN <5 (L) 6 - 23 mg/dL   Creatinine, Ser 0.85 0.50 - 1.35 mg/dL   Calcium 8.6 8.4 - 10.5 mg/dL   GFR calc non Af Amer >90 >90 mL/min   GFR calc Af Amer >90 >90 mL/min    Comment: (NOTE) The eGFR has been calculated using the CKD EPI equation. This calculation has not been validated in all clinical situations. eGFR's persistently <90 mL/min signify possible Chronic Kidney Disease.    Anion gap 8 5 - 15  CBC     Status: Abnormal   Collection Time: 08/01/14  3:35 AM  Result Value Ref Range   WBC 23.2 (H) 4.0 - 10.5 K/uL   RBC 5.33 4.22 - 5.81 MIL/uL   Hemoglobin 15.0 13.0 - 17.0 g/dL   HCT 45.3 39.0 - 52.0 %   MCV 85.0 78.0 - 100.0 fL   MCH 28.1 26.0 - 34.0 pg   MCHC 33.1 30.0 - 36.0 g/dL   RDW 16.1 (H) 11.5 - 15.5 %   Platelets 141 (L) 150 - 400 K/uL  Heparin level (unfractionated)     Status: Abnormal   Collection Time: 08/01/14  3:35 AM  Result Value Ref Range   Heparin Unfractionated 0.13 (L) 0.30 - 0.70 IU/mL    Comment:        IF HEPARIN RESULTS ARE BELOW EXPECTED VALUES, AND PATIENT DOSAGE HAS BEEN CONFIRMED, SUGGEST FOLLOW UP TESTING OF ANTITHROMBIN III LEVELS.   Reticulocytes      Status: None   Collection Time: 08/01/14  3:35 AM  Result Value Ref Range   Retic Ct Pct 2.4 0.4 - 3.1 %   RBC. 5.34 4.22 - 5.81 MIL/uL   Retic Count, Manual 128.2 19.0 - 186.0 K/uL  Heparin level (unfractionated)     Status: Abnormal   Collection Time: 08/01/14 11:50 AM  Result Value Ref Range   Heparin Unfractionated 0.28 (L) 0.30 - 0.70 IU/mL    Comment:        IF HEPARIN RESULTS ARE BELOW EXPECTED VALUES, AND PATIENT DOSAGE HAS BEEN CONFIRMED, SUGGEST FOLLOW UP TESTING OF ANTITHROMBIN III LEVELS.   Save smear     Status: None   Collection Time: 08/01/14 12:33 PM  Result Value Ref Range   Smear Review SMEAR STAINED AND AVAILABLE FOR REVIEW   CBC with Differential     Status: None (Preliminary result)   Collection Time: 08/01/14  4:53 PM  Result Value Ref Range   WBC PENDING K/uL   RBC PENDING MIL/uL   Hemoglobin PENDING g/dL   HCT PENDING %   MCV PENDING fL   MCH PENDING pg   MCHC PENDING g/dL   RDW PENDING %   Platelets PENDING K/uL   Neutrophils Relative % DUPLICATE REQUEST ADDED TO AM CBC %   Neutro Abs DUPLICATE REQUEST ADDED TO AM CBC K/uL   Band Neutrophils DUPLICATE REQUEST ADDED TO AM CBC %   Lymphocytes Relative DUPLICATE REQUEST ADDED TO AM CBC %   Lymphs Abs DUPLICATE REQUEST ADDED TO AM CBC K/uL   Monocytes Relative DUPLICATE REQUEST ADDED TO AM CBC %   Monocytes Absolute DUPLICATE REQUEST ADDED TO AM CBC K/uL   Eosinophils Relative DUPLICATE REQUEST ADDED TO AM CBC %   Eosinophils Absolute DUPLICATE REQUEST ADDED TO AM CBC K/uL   Basophils Relative DUPLICATE REQUEST ADDED TO AM CBC %   Basophils Absolute DUPLICATE REQUEST ADDED TO AM CBC K/uL   LUCs, % DUPLICATE REQUEST ADDED TO AM CBC %   LUC, Absolute DUPLICATE REQUEST ADDED TO AM CBC K/uL   WBC Morphology DUPLICATE REQUEST ADDED TO AM CBC    RBC Morphology DUPLICATE REQUEST ADDED TO AM CBC    Smear Review DUPLICATE REQUEST ADDED TO AM CBC    Other DUPLICATE REQUEST ADDED TO AM CBC %   Other 2  DUPLICATE REQUEST ADDED TO AM CBC %   nRBC DUPLICATE REQUEST ADDED TO AM CBC /100 WBC   Metamyelocytes Relative DUPLICATE REQUEST ADDED TO AM CBC %   Myelocytes DUPLICATE REQUEST ADDED TO AM CBC %   Promyelocytes Absolute DUPLICATE REQUEST ADDED TO AM CBC %   Blasts DUPLICATE REQUEST ADDED TO AM CBC %  Heparin level (unfractionated)     Status: Abnormal   Collection Time: 08/01/14  6:30 PM  Result Value Ref Range   Heparin Unfractionated 0.28 (L) 0.30 - 0.70 IU/mL    Comment:        IF HEPARIN RESULTS ARE BELOW EXPECTED VALUES, AND PATIENT DOSAGE HAS BEEN CONFIRMED, SUGGEST FOLLOW UP TESTING OF ANTITHROMBIN III LEVELS.   Magnesium     Status: None   Collection Time: 08/01/14  6:30  PM  Result Value Ref Range   Magnesium 2.1 1.5 - 2.5 mg/dL  Troponin I (q 6hr x 3)     Status: None   Collection Time: 08/01/14  6:30 PM  Result Value Ref Range   Troponin I <0.03 <0.031 ng/mL    Comment:        NO INDICATION OF MYOCARDIAL INJURY.   Basic metabolic panel     Status: Abnormal   Collection Time: 08/01/14  6:30 PM  Result Value Ref Range   Sodium 138 135 - 145 mmol/L   Potassium 3.6 3.5 - 5.1 mmol/L   Chloride 104 96 - 112 mmol/L   CO2 24 19 - 32 mmol/L   Glucose, Bld 132 (H) 70 - 99 mg/dL   BUN 7 6 - 23 mg/dL   Creatinine, Ser 0.90 0.50 - 1.35 mg/dL   Calcium 8.4 8.4 - 10.5 mg/dL   GFR calc non Af Amer >90 >90 mL/min   GFR calc Af Amer >90 >90 mL/min    Comment: (NOTE) The eGFR has been calculated using the CKD EPI equation. This calculation has not been validated in all clinical situations. eGFR's persistently <90 mL/min signify possible Chronic Kidney Disease.    Anion gap 10 5 - 15  Differential     Status: None   Collection Time: 08/01/14  6:30 PM  Result Value Ref Range   Neutro Abs 2.9 1.7 - 7.7 K/uL   Lymphs Abs 1.5 0.7 - 4.0 K/uL   Monocytes Absolute 0.8 0.1 - 1.0 K/uL   Eosinophils Absolute 0.1 0.0 - 0.7 K/uL   Basophils Absolute 0.0 0.0 - 0.1 K/uL    Neutrophils Relative % 60 43 - 77 %   Lymphocytes Relative 35 12 - 46 %   Monocytes Relative 4 3 - 12 %   Eosinophils Relative 1 0 - 5 %   Basophils Relative 0 0 - 1 %   RBC Morphology POLYCHROMASIA PRESENT    WBC Morphology TOXIC GRANULATION     Comment: ATYPICAL LYMPHOCYTES SMUDGE CELLS   Reticulocytes     Status: None   Collection Time: 08/01/14  6:30 PM  Result Value Ref Range   Retic Ct Pct 2.1 0.4 - 3.1 %   RBC. 5.24 4.22 - 5.81 MIL/uL   Retic Count, Manual 110.0 19.0 - 186.0 K/uL  Troponin I (q 6hr x 3)     Status: None   Collection Time: 08/01/14 10:58 PM  Result Value Ref Range   Troponin I <0.03 <0.031 ng/mL    Comment:        NO INDICATION OF MYOCARDIAL INJURY.   Troponin I (q 6hr x 3)     Status: None   Collection Time: 08/02/14  5:15 AM  Result Value Ref Range   Troponin I <0.03 <0.031 ng/mL    Comment:        NO INDICATION OF MYOCARDIAL INJURY.   Heparin level (unfractionated)     Status: None   Collection Time: 08/02/14  5:15 AM  Result Value Ref Range   Heparin Unfractionated 0.50 0.30 - 0.70 IU/mL    Comment:        IF HEPARIN RESULTS ARE BELOW EXPECTED VALUES, AND PATIENT DOSAGE HAS BEEN CONFIRMED, SUGGEST FOLLOW UP TESTING OF ANTITHROMBIN III LEVELS.   CBC     Status: Abnormal   Collection Time: 08/02/14  5:15 AM  Result Value Ref Range   WBC 19.8 (H) 4.0 - 10.5 K/uL   RBC 5.26 4.22 -  5.81 MIL/uL   Hemoglobin 14.8 13.0 - 17.0 g/dL   HCT 45.2 39.0 - 52.0 %   MCV 85.9 78.0 - 100.0 fL   MCH 28.1 26.0 - 34.0 pg   MCHC 32.7 30.0 - 36.0 g/dL   RDW 16.2 (H) 11.5 - 15.5 %   Platelets 129 (L) 150 - 400 K/uL  Factor 5 assay     Status: None   Collection Time: 08/02/14 11:35 AM  Result Value Ref Range   Factor V Activity 85 65 - 150 %    Comment: Units: % of normal Performed at [facility]   Prothrombin gene mutation     Status: None   Collection Time: 08/02/14 11:35 AM  Result Value Ref Range   Recommendations-PTGENE: Comment      Comment: (NOTE) NEGATIVE No mutation identified. Comment: A point mutation ([id number]) in the factor II (prothrombin) gene is the second most common cause of inherited thrombophilia. The incidence of this mutation in the [place] Caucasian population is about 2% and in the [place] American population it is approximately 0.5%. This mutation is rare in the [place] and Native American population. Being heterozygous for a prothrombin mutation increases the risk for developing venous thrombosis about 2 to 3 times above the general population risk. Being homozygous for the prothrombin gene mutation increases the relative risk for venous thrombosis further, although it is not yet known how much further the risk is increased. In women heterozygous for the prothrombin gene mutation, the use of estrogen containing oral contraceptives increases the relative risk of venous thrombosis about 16 times and the risk of developing cerebral thrombosis is also significantly increased. In pregnancy the pr othrombin gene mutation increases risk for venous thrombosis and may increase risk for stillbirth, placental abruption, pre-eclampsia and fetal growth restriction. If the patient possesses two or more congenital or acquired thrombophilic risk factors, the risk for thrombosis may rise to more than the sum of the risk ratios for the individual mutations. This assay detects only the prothrombin G20210A mutation and does not measure genetic abnormalities elsewhere in the genome. Other thrombotic risk factors may be pursued through systematic clinical laboratory analysis. These factors include the R506Q (Leiden) mutation in the Factor V gene, plasma homocysteine levels, as well as testing for deficiencies of antithrombin III, protein C and protein S.    Additional Information Comment     Comment: (NOTE) Genetic Counselors are available for health care providers to discuss results at [phone]  [phone]). Methodology: DNA analysis of the Factor II gene was performed by PCR amplification followed by restriction analysis. The diagnostic sensitivity is >99% for both. All the tests must be combined with clinical information for the most accurate interpretation. Molecular-based testing is highly accurate, but as in any laboratory test, diagnostic errors may occur. Poort SR, et al. Blood. 1996; 32:9518-8416. Varga EA. Circulation. 2004; 606:T01-S01. Mervin Hack, et [place]; 19:700-703. Allison Quarry, PhD Berenice Primas, PhD Jens Som, PhD Broadus John, PhD Alfredo Bach, PhD Norva Riffle, PhD Performed At: [facility] [address] [place], [place] [id number] Nechama Guard MD UK:[id number]   Antithrombin III     Status: Abnormal   Collection Time: 08/02/14 11:35 AM  Result Value Ref Range   AntiThromb III Func 65 (L) 75 - 120 %  Heparin level (unfractionated)     Status: None   Collection Time: 08/03/14  5:05 AM  Result Value Ref Range   Heparin Unfractionated 0.51 0.30 -  0.70 IU/mL    Comment:        IF HEPARIN RESULTS ARE BELOW EXPECTED VALUES, AND PATIENT DOSAGE HAS BEEN CONFIRMED, SUGGEST FOLLOW UP TESTING OF ANTITHROMBIN III LEVELS.   CBC     Status: Abnormal   Collection Time: 08/03/14  5:05 AM  Result Value Ref Range   WBC 19.3 (H) 4.0 - 10.5 K/uL   RBC 5.32 4.22 - 5.81 MIL/uL   Hemoglobin 14.9 13.0 - 17.0 g/dL   HCT 45.9 39.0 - 52.0 %   MCV 86.3 78.0 - 100.0 fL   MCH 28.0 26.0 - 34.0 pg   MCHC 32.5 30.0 - 36.0 g/dL   RDW 16.1 (H) 11.5 - 15.5 %   Platelets 136 (L) 150 - 400 K/uL  Basic metabolic panel     Status: Abnormal   Collection Time: 08/03/14  5:05 AM  Result Value Ref Range   Sodium 135 135 - 145 mmol/L   Potassium 4.0 3.5 - 5.1 mmol/L   Chloride 109 96 - 112 mmol/L   CO2 23 19 - 32 mmol/L   Glucose, Bld 108 (H) 70 - 99 mg/dL   BUN 6 6 - 23 mg/dL   Creatinine, Ser 0.62 0.50 - 1.35 mg/dL    Calcium 8.2 (L) 8.4 - 10.5 mg/dL   GFR calc non Af Amer >90 >90 mL/min   GFR calc Af Amer >90 >90 mL/min    Comment: (NOTE) The eGFR has been calculated using the CKD EPI equation. This calculation has not been validated in all clinical situations. eGFR's persistently <90 mL/min signify possible Chronic Kidney Disease.    Anion gap 3 (L) 5 - 15  Glucose, capillary     Status: Abnormal   Collection Time: 08/03/14 12:26 PM  Result Value Ref Range   Glucose-Capillary 123 (H) 70 - 99 mg/dL   Comment 1 Documented in Char    Comment 2 Repeat Test   Heparin level (unfractionated)     Status: Abnormal   Collection Time: 08/04/14  5:16 AM  Result Value Ref Range   Heparin Unfractionated 0.90 (H) 0.30 - 0.70 IU/mL    Comment:        IF HEPARIN RESULTS ARE BELOW EXPECTED VALUES, AND PATIENT DOSAGE HAS BEEN CONFIRMED, SUGGEST FOLLOW UP TESTING OF ANTITHROMBIN III LEVELS.   CBC     Status: Abnormal   Collection Time: 08/04/14  5:16 AM  Result Value Ref Range   WBC 22.6 (H) 4.0 - 10.5 K/uL   RBC 5.50 4.22 - 5.81 MIL/uL   Hemoglobin 15.5 13.0 - 17.0 g/dL   HCT 47.3 39.0 - 52.0 %   MCV 86.0 78.0 - 100.0 fL   MCH 28.2 26.0 - 34.0 pg   MCHC 32.8 30.0 - 36.0 g/dL   RDW 16.2 (H) 11.5 - 15.5 %   Platelets 167 150 - 400 K/uL  CBC & Diff and Retic     Status: Abnormal   Collection Time: 09/01/14  4:26 PM  Result Value Ref Range   WBC 20.7 (H) 4.0 - 10.3 10e3/uL   NEUT# 3.1 1.5 - 6.5 10e3/uL   HGB 16.3 13.0 - 17.1 g/dL   HCT 48.8 38.4 - 49.9 %   Platelets 109 (L) 140 - 400 10e3/uL   MCV 85.9 79.3 - 98.0 fL   MCH 28.7 27.2 - 33.4 pg   MCHC 33.4 32.0 - 36.0 g/dL   RBC 5.68 4.20 - 5.82 10e6/uL   RDW 16.4 (H) 11.0 - 14.6 %  lymph# 16.3 (H) 0.9 - 3.3 10e3/uL   MONO# 1.2 (H) 0.1 - 0.9 10e3/uL   Eosinophils Absolute 0.0 0.0 - 0.5 10e3/uL   Basophils Absolute 0.0 0.0 - 0.1 10e3/uL   NEUT% 15.1 (L) 39.0 - 75.0 %   LYMPH% 78.9 (H) 14.0 - 49.0 %   MONO% 5.7 0.0 - 14.0 %   EOS% 0.2 0.0 -  7.0 %   BASO% 0.1 0.0 - 2.0 %   Retic % 1.54 0.80 - 1.80 %   Retic Ct Abs 87.47 34.80 - 93.90 10e3/uL   Immature Retic Fract 15.90 (H) 3.00 - 10.60 %  TECHNOLOGIST REVIEW     Status: None   Collection Time: 09/01/14  4:26 PM  Result Value Ref Range   Technologist Review Variant lymphs present,smudge cells   Comprehensive metabolic panel (Cmet) - [facility]     Status: Abnormal   Collection Time: 09/01/14  4:26 PM  Result Value Ref Range   Sodium 141 136 - 145 mEq/L   Potassium 4.2 3.5 - 5.1 mEq/L   Chloride 106 98 - 109 mEq/L   CO2 23 22 - 29 mEq/L   Glucose 99 70 - 140 mg/dl   BUN 10.5 7.0 - 26.0 mg/dL   Creatinine 0.8 0.7 - 1.3 mg/dL   Total Bilirubin 1.74 (H) 0.20 - 1.20 mg/dL   Alkaline Phosphatase 60 40 - 150 U/L   AST 15 5 - 34 U/L   ALT 19 0 - 55 U/L   Total Protein 6.6 6.4 - 8.3 g/dL   Albumin 4.0 3.5 - 5.0 g/dL   Calcium 9.1 8.4 - 10.4 mg/dL   Anion Gap 12 (H) 3 - 11 mEq/L   EGFR >90 >90 ml/min/1.73 m2    Comment: eGFR is calculated using the CKD-EPI Creatinine Equation (2009)    RADIOGRAPHIC STUDIES: I have personally reviewed the radiological images as listed and agreed with the findings in the report.  CT anginal chest 07/31/2014  IMPRESSION: 1. Positive for acute PE with CT evidence of right heartstrain (RV/LV Ratio = 1.15) consistent with at least submassive (intermediate risk)PE. The presence of right heart strain has been associated with anincreased risk of morbidity and mortality. Consultation with Pulmonary and Critical Care Medicine is recommended. 2. Multiple borderline enlarged and mildly enlarged mediastinal and hilar lymph nodes, as well as bilateral axillary lymph nodes. Clinical correlation for underlying lymphoproliferative disorder is suggested. 3. Hepatic steatosis.  CT abdomen and pelvis with contrast 08/02/2014 IMPRESSION: There are numerous mesenteric and retroperitoneal lymph nodes in the abdomen and pelvis. Many were present previously but  all are enlarged when compared to the prior study, and there has been an increase in number as well. Lymphoproliferative disorder suspected and further investigation is warranted    ASSESSMENT:  59 yo male without significant PMH, presented with left calf pain for a few months, dyspnea for 3 days, was found to have left calf DVT and bilateral pulmonary embolism.   1. Unprovoked DVT/PE -Due to his current anticoagulation, I recommended limited hypercoagulation workup, including prothrombin gene mutation, factor V Leiden, and antithrombin III, which came back negative except slightly decreased antithrombin III activity. -Due to his persistent lymphocytosis and diffuse adenopathy, I'm concerned about CLL/SLL. If it is confirmed by flow cytometry, I recommend anticoagulation indefinitely. -I discussed the options of anticoagulation. He prefers to switch to oral drug, we discussed the pros and cons of committing and Xarelto he opted switching to Xarelto due to convenience.  -I sent a prescription  of Xarelto 20 mg once daily to his pharmacy today.  2. Lymphocytosis and diffuse adenopathy, possible CLL  -No clinical evidence of infection -His white count has been persistently high in the range of 20K, differential showed predominant lymphocytes, with absolute lymph count 16.3. -CT scan showed diffuse lymphadenopathy. This is highly suspicious for CLL/S LL -I'll obtain a flow cytometry to ruled out or CLL.  3. Mild thrombocytopenia -Possible related to his acute thrombosis -If it doesn't improve, it could be related to his CLL.  Follow up: Repeat lab in one month, including flow cytometry, and return to clinic in 2 months for follow-up. We'll obtain a Doppler of lower extremities before next visit.   Orders Placed This Encounter  Procedures  . CBC & Diff and Retic    Standing Status: Standing     Number of Occurrences: 10     Standing Expiration Date: 08/31/2016  . Comprehensive metabolic  panel (Cmet) - [facility]    Standing Status: Standing     Number of Occurrences: 10     Standing Expiration Date: 08/31/2016  . Draw extra clot tube    Standing Status: Future     Number of Occurrences: 1     Standing Expiration Date: 09/01/2015  . Lower Extremity Venous Duplex Bilateral    Standing Status: Future     Number of Occurrences:      Standing Expiration Date: 09/02/2015    Order Specific Question:  Laterality    Answer:  Bilateral    Order Specific Question:  Where should this test be performed:    Answer:  [facility]    All questions were answered. The patient knows to call the clinic with any problems, questions or concerns. I spent 30 minutes counseling the patient face to face. The total time spent in the appointment was 40 minutes and more than 50% was on counseling.     Truitt Merle, MD 09/01/2014

## 2014-09-07 NOTE — Assessment & Plan Note (Signed)
CT chest 07/31/14 Multiple borderline enlarged and mildly enlarged mediastinal and hilar lymph nodes, as well as bilateral axillary lymph nodes   Discussed in detail all the  indications, usual  risks and alternatives  relative to the benefits with patient who agrees to proceed with conservative f/u for now - there is no indication for any kind of an invasive procedure this soon after PE p RV strain and if there ever is concern re adenopathy best to bx the axillary node first.

## 2014-09-07 NOTE — Assessment & Plan Note (Signed)
Dx 07/31/14 CTa Venous dopplers 07/31/14 deep vein thrombosis involving the left femoral vein and left popliteal vein. Echo 08/01/14 Left ventricle: The cavity size was normal. Wall thickness was increased in a pattern of moderate LVH. Systolic function was normal. The estimated ejection fraction was in the range of 50% to 55%. Wall motion was normal; there were no regional wall motion abnormalities. Due to tachycardia, there was fusion of early and atrial contributions to ventricular filling. - Left atrium: The atrium was moderately dilated. - Right ventricle: The cavity size was moderately dilated. Systolic function was mildly to moderately reduced. 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

## 2014-09-09 ENCOUNTER — Telehealth: Payer: Self-pay | Admitting: Hematology

## 2014-09-09 NOTE — Telephone Encounter (Signed)
Faxed pt medical records to [facility] for a second opinion [phone]

## 2014-09-18 ENCOUNTER — Telehealth: Payer: Self-pay | Admitting: Hematology

## 2014-09-18 NOTE — Telephone Encounter (Signed)
Returned patient voicemail needing to cancel appointments. Per patient is speaking witha a new [facility] regarding treatment.

## 2014-09-30 ENCOUNTER — Other Ambulatory Visit: Payer: Commercial Managed Care - PPO

## 2014-10-17 ENCOUNTER — Other Ambulatory Visit (HOSPITAL_COMMUNITY): Payer: Commercial Managed Care - PPO

## 2014-10-20 ENCOUNTER — Encounter (HOSPITAL_COMMUNITY): Payer: Commercial Managed Care - PPO

## 2014-10-28 ENCOUNTER — Ambulatory Visit: Payer: Commercial Managed Care - PPO | Admitting: Hematology

## 2014-10-28 ENCOUNTER — Other Ambulatory Visit: Payer: Commercial Managed Care - PPO

## 2015-03-16 ENCOUNTER — Other Ambulatory Visit: Payer: Self-pay | Admitting: Oncology

## 2015-03-16 ENCOUNTER — Inpatient Hospital Stay (HOSPITAL_COMMUNITY)
Admission: EM | Admit: 2015-03-16 | Discharge: 2015-03-18 | DRG: 176 | Disposition: A | Discharge status: Home / Self Care | Payer: Commercial Managed Care - PPO | Attending: Internal Medicine | Admitting: Internal Medicine

## 2015-03-16 ENCOUNTER — Emergency Department (HOSPITAL_COMMUNITY): Payer: Commercial Managed Care - PPO

## 2015-03-16 ENCOUNTER — Encounter (HOSPITAL_COMMUNITY): Payer: Self-pay | Admitting: Family Medicine

## 2015-03-16 DIAGNOSIS — R590 Localized enlarged lymph nodes: Secondary | ICD-10-CM | POA: Diagnosis present

## 2015-03-16 DIAGNOSIS — J9811 Atelectasis: Secondary | ICD-10-CM | POA: Diagnosis present

## 2015-03-16 DIAGNOSIS — I493 Ventricular premature depolarization: Secondary | ICD-10-CM | POA: Diagnosis present

## 2015-03-16 DIAGNOSIS — D751 Secondary polycythemia: Secondary | ICD-10-CM | POA: Insufficient documentation

## 2015-03-16 DIAGNOSIS — I8390 Asymptomatic varicose veins of unspecified lower extremity: Secondary | ICD-10-CM | POA: Diagnosis present

## 2015-03-16 DIAGNOSIS — I2699 Other pulmonary embolism without acute cor pulmonale: Secondary | ICD-10-CM

## 2015-03-16 DIAGNOSIS — R739 Hyperglycemia, unspecified: Secondary | ICD-10-CM

## 2015-03-16 DIAGNOSIS — D696 Thrombocytopenia, unspecified: Secondary | ICD-10-CM | POA: Diagnosis present

## 2015-03-16 DIAGNOSIS — E669 Obesity, unspecified: Secondary | ICD-10-CM | POA: Diagnosis present

## 2015-03-16 DIAGNOSIS — I429 Cardiomyopathy, unspecified: Secondary | ICD-10-CM | POA: Diagnosis not present

## 2015-03-16 DIAGNOSIS — R0602 Shortness of breath: Secondary | ICD-10-CM | POA: Diagnosis present

## 2015-03-16 DIAGNOSIS — R918 Other nonspecific abnormal finding of lung field: Secondary | ICD-10-CM | POA: Diagnosis not present

## 2015-03-16 DIAGNOSIS — R161 Splenomegaly, not elsewhere classified: Secondary | ICD-10-CM | POA: Diagnosis present

## 2015-03-16 DIAGNOSIS — Z789 Other specified health status: Secondary | ICD-10-CM

## 2015-03-16 DIAGNOSIS — Z86718 Personal history of other venous thrombosis and embolism: Secondary | ICD-10-CM

## 2015-03-16 DIAGNOSIS — Z7901 Long term (current) use of anticoagulants: Secondary | ICD-10-CM | POA: Diagnosis not present

## 2015-03-16 DIAGNOSIS — Z6836 Body mass index (BMI) 36.0-36.9, adult: Secondary | ICD-10-CM | POA: Diagnosis not present

## 2015-03-16 DIAGNOSIS — C9110 Chronic lymphocytic leukemia of B-cell type not having achieved remission: Secondary | ICD-10-CM | POA: Diagnosis present

## 2015-03-16 DIAGNOSIS — R599 Enlarged lymph nodes, unspecified: Secondary | ICD-10-CM | POA: Diagnosis not present

## 2015-03-16 DIAGNOSIS — I442 Atrioventricular block, complete: Secondary | ICD-10-CM

## 2015-03-16 DIAGNOSIS — R0902 Hypoxemia: Secondary | ICD-10-CM | POA: Diagnosis not present

## 2015-03-16 HISTORY — DX: Chronic lymphocytic leukemia of B-cell type not having achieved remission: C91.10

## 2015-03-16 HISTORY — DX: Ventricular premature depolarization: I49.3

## 2015-03-16 HISTORY — DX: Other problems related to lifestyle: Z72.89

## 2015-03-16 HISTORY — DX: Acute embolism and thrombosis of unspecified deep veins of unspecified lower extremity: I82.409

## 2015-03-16 HISTORY — DX: Cardiomyopathy, unspecified: I42.9

## 2015-03-16 HISTORY — DX: Alcohol use, unspecified, uncomplicated: F10.90

## 2015-03-16 HISTORY — DX: Other pulmonary embolism without acute cor pulmonale: I26.99

## 2015-03-16 LAB — CBC
HCT: 53 % — ABNORMAL HIGH (ref 39.0–52.0)
Hemoglobin: 17.9 g/dL — ABNORMAL HIGH (ref 13.0–17.0)
MCH: 29.3 pg (ref 26.0–34.0)
MCHC: 33.8 g/dL (ref 30.0–36.0)
MCV: 86.7 fL (ref 78.0–100.0)
PLATELETS: 121 10*3/uL — AB (ref 150–400)
RBC: 6.11 MIL/uL — AB (ref 4.22–5.81)
RDW: 17.2 % — AB (ref 11.5–15.5)
WBC: 28.1 10*3/uL — AB (ref 4.0–10.5)

## 2015-03-16 LAB — BASIC METABOLIC PANEL
Anion gap: 8 (ref 5–15)
BUN: 9 mg/dL (ref 6–20)
CALCIUM: 9.1 mg/dL (ref 8.9–10.3)
CHLORIDE: 105 mmol/L (ref 101–111)
CO2: 25 mmol/L (ref 22–32)
CREATININE: 0.79 mg/dL (ref 0.61–1.24)
Glucose, Bld: 117 mg/dL — ABNORMAL HIGH (ref 65–99)
Potassium: 4.6 mmol/L (ref 3.5–5.1)
SODIUM: 138 mmol/L (ref 135–145)

## 2015-03-16 LAB — HEPARIN ANTI-XA

## 2015-03-16 LAB — I-STAT TROPONIN, ED: TROPONIN I, POC: 0 ng/mL (ref 0.00–0.08)

## 2015-03-16 MED ORDER — IOHEXOL 350 MG/ML SOLN
80.0000 mL | Freq: Once | INTRAVENOUS | Status: AC | PRN
  Administered 2015-03-16: 80 mL via INTRAVENOUS

## 2015-03-16 MED ORDER — ONDANSETRON HCL 4 MG PO TABS: 4.0000 mg | ORAL_TABLET | Freq: Four times a day (QID) | ORAL | Status: DC | PRN

## 2015-03-16 MED ORDER — ASPIRIN 81 MG PO CHEW
324.0000 mg | CHEWABLE_TABLET | Freq: Once | ORAL | Status: AC
Start: 2015-03-16 — End: 2015-03-16
  Administered 2015-03-16: 243 mg via ORAL
  Filled 2015-03-16: qty 4

## 2015-03-16 MED ORDER — ENOXAPARIN SODIUM 150 MG/ML ~~LOC~~ SOLN
1.0000 mg/kg | Freq: Two times a day (BID) | SUBCUTANEOUS | Status: DC
  Administered 2015-03-16 – 2015-03-17 (×3): 130 mg via SUBCUTANEOUS
  Filled 2015-03-16 (×6): qty 1

## 2015-03-16 MED ORDER — ONDANSETRON HCL 4 MG/2ML IJ SOLN: 4.0000 mg | Freq: Four times a day (QID) | INTRAMUSCULAR | Status: DC | PRN

## 2015-03-16 MED ORDER — SODIUM CHLORIDE 0.9 % IJ SOLN
3.0000 mL | Freq: Two times a day (BID) | INTRAMUSCULAR | Status: DC
  Administered 2015-03-16 – 2015-03-18 (×3): 3 mL via INTRAVENOUS

## 2015-03-16 NOTE — Consult Note (Signed)
[facility] CONSULTATION  Patient Care Team: Harle Battiest, MD as PCP - General (Obstetrics and Gynecology)  REASON FOR CONSULT: Clotting Issues  Consulting Physician: Lurline Del, MD  HISTORY OF PRESENTING ILLNESS:  Ross [name] 59 y.o. [employer] man with a diagnosis of unprovoked DVT/PE and history of CLL/SLL as described below, not on Xarelto for the last month as he had completed a 6 month course, admitted via ED after presenting today with recurrent, acute shortness of breath starting this morning. The patient denies any fever, chills or night sweats. Denies lower extremity pain but does report LLE swelling without  erythema. Denies pre-syncopal episodes, palpitations or hemoptysis. Denies any bleeding issues such as epistaxis, hematemesis, hematuria or hematochezia. Ambulating without difficulty. Denies tobacco use, has never smoked. Patient denies taking hormone replacement therapy. He denies taking any recent long-distance trips. A CT angio revealed acute on chronic pulmonary embolism on the right with large clot burden. Resolved right ventricular enlargement/strain. In addition, he has stable axillary and mediastinal adenopathy with probable splenomegaly.  He was placed on Lovenox 150 mg sq. We have been notified of the patient's admission to help in the management of his hematological issues.   Oncological History:  1. Unprovoked DVT/PE, recurrent He was in previous good health until March of 2016 when he developed increasing SOB with minimal activity, along with chest pain, and lower extremity edema after a recent trip 3 weeks prior.  He was admitted to the hospital on 3//3/16 and was found to have a LLE DVT and PE.  He was started on Lovenox and converted to Xarelto 20 mg once daily, completing its course one month ago. He is now hospitalized with recurrent PE, requiring Lovenox  2.CLL /SLL --His white count has been persistently high in the range of  20K, differential showed predominant lymphocytes Chest CT scan showed borderline and mildly enlarged mediastinal, hilar, and axillary lymph nodes.   Peripheral blood for flow cytometry was performed which showed a 54% population of CD5/CD23 positive, kappa-restricted B-cells population consistent with CLL /SLL. The specimen was also Zap-70 positive.  He is followed by Dr. Robyne Askew at [facility] angio of the chest shows stable lymph nodes  3. Mild thrombocytopenia -Possible related to his thrombosis and CLL, and possible splenomegaly This is monitored at the clinic of Dr. Robyne Askew.   MEDICAL HISTORY:  Past Medical History  Diagnosis Date  . Acute meniscal tear of knee LEFT  . PE (pulmonary embolism)   . CLL (chronic lymphocytic leukemia) ([place]) 03/16/2015  . DVT of leg (deep venous thrombosis) ([place])     SURGICAL HISTORY: Past Surgical History  Procedure Laterality Date  . Left inguinal hernia repair w/ mesh  06-10-2005  . Fatty cyst removed from flank and back      FLANK- 15 YRS AGO/  BACK 12 YRS AGO  . Knee arthroscopy  07/22/2011    Procedure: ARTHROSCOPY KNEE;  Surgeon: Magnus Sinning, MD;  Location: [facility];  Service: Orthopedics;  Laterality: Left;  Partial medial menisectomy and shaving of the patella    SOCIAL HISTORY: Social History   Social History  . Marital Status: Married    Spouse Name: N/A  . Number of Children: N/A  . Years of Education: N/A   Occupational History  . Not on file.   Social History Main Topics  . Smoking status: Never Smoker   . Smokeless tobacco: Never Used  . Alcohol Use: 3.6 oz/week  6 Shots of liquor per week  . Drug Use: No  . Sexual Activity: Not on file   Other Topics Concern  . Not on file   Social History Narrative    FAMILY HISTORY: Family History  Problem Relation Age of Onset  . Cancer Father     "in lymph nodes"    ALLERGIES:  has No Known Allergies.  MEDICATIONS:   Scheduled Meds: .  enoxaparin (LOVENOX) injection  1 mg/kg Subcutaneous Q12H     ROS: Constitutional: Denies fevers, chills or abnormal night sweats Eyes: Denies blurriness of vision, double vision or watery eyes Ears, nose, mouth, throat, and face: Denies mucositis or sore throat Respiratory: Denies cough, his acute dyspnea has now resolved. He denies wheezes Cardiovascular: Denies palpitation, chest discomfort . He reports left lower extremity swelling Gastrointestinal:  Denies nausea, heartburn or change in bowel habits. Denies abdominal pain Skin: Denies abnormal skin rashes Lymphatics: Denies new lymphadenopathy or easy bruising Neurological:Denies numbness, tingling or new weaknesses Behavioral/Psych: Mood is stable, no new changes  All other systems were reviewed with the patient and are negative.   Physical Exam   ECOG PERFORMANCE STATUS: 1 Symptomatic but completely ambulatory (Restricted in physically strenuous activity but ambulatory and able to carry out work of a light or sedentary nature. For example, light housework, office work)     Scientist, forensic Vitals:   03/16/15 1130  BP: 130/67  Pulse: 60  Temp:   Resp: 18   Filed Weights   03/16/15 1000  Weight: 284 lb (128.822 kg)    GENERAL: alert, no distress and comfortable SKIN:  color, texture, turgor are normal, no rashes or significant lesions EYES: normal, conjunctiva are pink and non-injected, sclera clear OROPHARYNX:no exudate, no erythema and lips, buccal mucosa, and tongue normal  NECK: supple, thyroid normal size, non-tender, without nodularity LYMPH:  no palpable lymphadenopathy in the cervical, axillary or inguinal LUNGS: clear to auscultation and percussion with normal breathing effort HEART: regular rate & rhythm and no murmurs and 1+ bilateral lower extremity edema ABDOMEN obese, soft, non-tender and normal bowel sounds Musculoskeletal:no cyanosis of digits and no clubbing  PSYCH: alert & oriented x 3 with fluent  speech NEURO: no focal motor/sensory deficits   LABORATORY DATA:    Recent Labs Lab 03/16/15 0820  WBC 28.1*  HGB 17.9*  HCT 53.0*  PLT 121*  MCV 86.7  MCH 29.3  MCHC 33.8  RDW 17.2*    Chemistries   Recent Labs Lab 03/16/15 0820  NA 138  K 4.6  CL 105  CO2 25  GLUCOSE 117*  BUN 9  CREATININE 0.79  CALCIUM 9.1     Radiology Studies:      Ct Angio Chest Pe W/cm &/or Wo Cm  03/16/2015 COMPARISON:  07/31/2014 FINDINGS: THORACIC INLET/BODY WALL: Unchanged prominence of bilateral hilar lymph nodes. No acute finding. MEDIASTINUM: Cardiomegaly with left ventricular enlargement. Normalized RV to LV ratio at 0.7. No acute aortic findings. Pulmonary artery filling defects beginning in the distal right main pulmonary artery and branching into all lobar arteries and multiple segmental pulmonary arteries. Much of the clot is central and acute appearing. There is also likely residual chronic clot which is marginalized. LUNG WINDOWS: Numerous peripheral or peribronchovascular pulmonary nodules, stable from prior when allowing for motion on the previous study. The largest measure up to 7 mm, including in the right middle lobe on series 407 image 69. Nodules are marked with arrows for follow-up purposes. UPPER ABDOMEN: Partially visualized spleen appears  enlarged with a 17 cm AP span and 6.6 cm thickness. OSSEOUS: No acute fracture.  No suspicious lytic or blastic lesions. Critical Value/emergent results were called by telephone at the time of interpretation on 03/16/2015 at 10:10 am to Dr. Montine Circle , who verbally acknowledged these results. Review of the MIP images confirms the above findings. IMPRESSION: 1. 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 Resolved right ventricular enlargement/strain. 2. Stable axillary and mediastinal adenopathy with probable splenomegaly. If previously recommended lymphoproliferative workup was negative, consider sarcoid. 3.  Stable numerous sub cm pulmonary nodules, likely benign and related to #2. The largest measures up to 7 mm; recommend 1 year follow-up chest CT to prove benignity. Electronically Signed   By: Monte Fantasia M.D.   On: 03/16/2015 10:12   Dg Chest Port 1 View  03/16/2015  CLINICAL DATA:  Shortness of breath and chest pain. Recent pulmonary embolism. EXAM: PORTABLE CHEST 1 VIEW COMPARISON:  07/31/2014 FINDINGS: Borderline cardiomegaly remains stable. Low lung volumes noted. Mild bibasilar pulmonary opacity may be due to atelectasis or infiltrate no evidence of pleural effusion or pneumothorax. IMPRESSION: Mild bibasilar atelectasis versus infiltrates. Electronically Signed   By: Earle Gell M.D.   On: 03/16/2015 08:57     ASSESSMENT/PLAN:   1. CLL/SLL A. Initial presentation with incidental finding of leukocytosis, 07/2014 B. Peripheral blood for flow cytometry 09/07/14: 54% population of CD5/CD23 positive, kappa-restricted B-cells population consistent with CLL /SLL. The specimen was also Zap-70 positive.  New CT angio of the chest shows stable adenopathy WBC is currently at 28,000, will continue to monitor. Patient is being followed by Dr. Robyne Askew at [facility] and is to return to his clinic upon discharge   2. History of DVT and PE, March 2016 (unprovoked) He was on Xarelto until 1 month ago He is now presenting with recurrent symptoms A CT angio revealed acute on chronic pulmonary embolism on the right with large clot burden.  He was placed on Lovenox 150 mg sq by Pharmacy  Agree with Lower extremity dopplers and 2 D echo to rule out thrombus, results pending He is likely to receive indefinite anticoagulation due to recurrence.  Preventive measures such as avoiding hormonal supplement, avoiding cigarette smoking, keeping up-to-date with screening programs for early cancer detection, frequent ambulation for long distance travel and aggressive DVT prophylaxis in all surgical settings is  recommended. Will follow   3. Thrombocytopenia, mild In the setting of CLL, anticoagulation and possible splenomegaly per CT No bleeding issues are reported. Continue to monitor.  No intervention is indicated at this time.  4. Mildly elevated Hemoglobin and Hematocrit, chronic In the setting of CLL This could contribute to his clotting issues. Monitor closely while in hospital Consider check smear  4. Full Code   WERTMAN,SARA E, PA-C @T @ 1:03 PM  I have seen the patient, examined him. I agree with the assessment and and plan and have edited the notes.   Patient is known to me, I initially saw him when he had the first episode of PE early this year, he later on transferred his care to [facility].  Given the recurrent severe PE, and underlying malignancy with CLL/SLL, I strongly recommend lifelong anticoagulation. Patient is in agreement with that. I recommend continue Lovenox 1mg /kg q12h for one month then switch to Xarelto 20mg  daily indefinitely.   His thrombocytopenia is probable related to her acute thrombosis. I recommend him to recheck in 2 months. If he still has thrombocytopenia, then  it could be related to his CLL and SLL. Thrombocytopenia in CLL is an indication for treatment.  He will follow up with Dr. Robyne Askew at [facility] for his anticoagulation and CLL/SLL management.  Truitt Merle  03/16/2015

## 2015-03-16 NOTE — Progress Notes (Signed)
ANTICOAGULATION CONSULT NOTE - Initial Consult  Pharmacy Consult for enoxaparin Indication: pulmonary embolus  No Known Allergies  Patient Measurements: Height: 6\' 2"  (188 cm) Weight: 284 lb (128.822 kg) IBW/kg (Calculated) : 82.2  Vital Signs: Temp: 98 F (36.7 C) (10/17 0819) BP: 173/87 mmHg (10/17 0819) Pulse Rate: 88 (10/17 0819)  Labs:  Recent Labs  03/16/15 0820  HGB 17.9*  HCT 53.0*  PLT 121*  CREATININE 0.79    Estimated Creatinine Clearance: 143.5 mL/min (by C-G formula based on Cr of 0.79).   Medical History: Past Medical History  Diagnosis Date  . Acute meniscal tear of knee LEFT  . PE (pulmonary embolism)    Assessment: 59 yo male presentng with SOB  PMH: PE (3/16 lovenox x 1 mo, rivaroxaban x 5 mo), CLL  AC: enoxaparin for PE. New acute on chronic PE. No right heart strain  CV: Acute on chronic PE  Renal: SCr 0.79  Heme: H&H 17.9/53, Plt 121  Goal of Therapy:  Monitor platelets by anticoagulation protocol: Yes   Plan:  Enoxaparin 1 mg/kg q12h CBC q72h Monitor for s/sx of bleeding F/u Long-term plans for [facility]  Levester Fresh, PharmD, BCPS Clinical Pharmacist Pager [phone] 03/16/2015 10:36 AM

## 2015-03-16 NOTE — ED Notes (Signed)
Pt here for SOB that started yesterday. sts hx of blood clots in his lungs. Taken off blood thinners a few months ago.

## 2015-03-16 NOTE — ED Notes (Signed)
Pt placed in gown and in bed. Pt monitored by pulse ox, bp cuff, and 12-lead. 

## 2015-03-16 NOTE — H&P (Signed)
[facility] History and Physical  Ross [name] WUJ:[id number] DOB: 05-10-1956 DOA: 03/16/2015  Referring physician: Emergency Department PCP: Harle Battiest, MD   CHIEF COMPLAINT: Shortness of breath  HPI: Ross [name] is a 59 y.o. 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  While hospitalized patient was also found to have multiple pulmonary nodules and multiple enlarged mediastinal/hilar and bilateral axillary nodes . Oncology evaluated and raised issue of malignancy, especially since father had lymphoma.  Patient was discharged home on Lovenox which was ultimately changed to Xarelto. He established care at [facility] with Dr. Leretha Pol for management of CLL/SLL and was last seen in July.  Per plan patient discontinued Xarelto a month ago. As far as CLL, for the time being no treatment recommended just follow-up on a six-month basis    Patient presents today with recurrent shortness of breath which started this a.m. No chest pain. Patient has an occasional cough but could be assessed sinus. No hemoptysis. Patient states shortness of breath is resolved, he actually feels quite well. No leg pain. Patient wears a compression stocking on his left lower extremity where he has a varicose vein and previously had a DVT    ED COURSE: SBP elevated the 170s, vital signs otherwise stable. O2 sats normal .  WBC 20 , hemoglobin 18, hematocrit 53, platelets 956, basic metabolic profile normal. Normal troponin  EKG Sinus arrhythmia Multiform ventricular premature complexes Prolonged PR interval LAD, consider LAFB or inferior infarct Borderline low voltage, extremity leads Borderline prolonged QT interval Baseline wander in lead(s) II III aVF V1 V2 V3 No significant change since last tracing bigeminy resolved  CT angiogram  acute on chronic pulmonary was multiple right with large clot burden. Resolved right  ventricular enlargement . Axillary and mediastinal adenopathy stable. Mild bowel basilar atelectasis versus infiltrate on chest x-ray  Medications  enoxaparin (LOVENOX) injection 130 mg (not administered)  aspirin chewable tablet 324 mg (243 mg Oral Given 03/16/15 0836)  iohexol (OMNIPAQUE) 350 MG/ML injection 80 mL (80 mLs Intravenous Contrast Given 03/16/15 0942)    REVIEW OF SYSTEMS:  Constitutional:  No weight loss, night sweats, fevers, chills, fatigue.  HEENT:  No headaches, difficulty swallowing, tooth/dental problems, sore throat.  No sneezing, itching, ear ache, nasal congestion, +post nasal drip,  Cardio-vascular:  No chest pain, orthopnea, PND, ++ LLE swelling  anasarca, dizziness, palpitations  GI:  No heartburn, indigestion, abdominal pain, nausea, vomiting, diarrhea, change in bowel habits, loss of appetite  Resp:  No shortness of breath with exertion or at rest. No excess mucus, no productive cough, No non-productive cough, No coughing up of blood.No change in color of mucus.No wheezing.No chest wall deformity  Skin:  no rash or lesions.  GU:  no dysuria, change in color of urine, no urgency or frequency. No flank pain.  Musculoskeletal:  No joint pain or swelling. No decreased range of motion. No back pain.  Psych:  No change in mood or affect. No depression or anxiety. No memory loss.   Past Medical History  Diagnosis Date  . Acute meniscal tear of knee LEFT  . PE (pulmonary embolism)   . CLL (chronic lymphocytic leukemia) ([place]) 03/16/2015  . DVT of leg (deep venous thrombosis) ([facility])     PSH: left partial medial meniscectomy 2013. Left inguinal repair 2012  Social History:  reports that he has never smoked. He has never used smokeless tobacco. He reports that  he drinks about 3.6 oz of alcohol per week. He reports that he does not use illicit drugs. Lives:  home   With:   wife  Assistive devices:   No assistive devices needed for ambulation.   No Known  Allergies  Family History  Problem Relation Age of Onset  . Cancer Father     "in lymph nodes"     Prior to Admission medications   Medication Sig Start Date End Date Taking? Authorizing Provider  enoxaparin (LOVENOX) 150 MG/ML injection Inject 0.85 mLs (130 mg total) into the skin every 12 (twelve) hours. Patient not taking: Reported on 03/16/2015 08/04/14   Belkys A Regalado, MD  rivaroxaban (XARELTO) 20 MG TABS tablet Take 1 tablet (20 mg total) by mouth daily with supper. Patient not taking: Reported on 03/16/2015 09/01/14   Truitt Merle, MD   PHYSICAL EXAM: Filed Vitals:   03/16/15 0819 03/16/15 1000  BP: 173/87   Pulse: 88   Temp: 98 F (36.7 C)   Resp: 20   Height:  6\' 2"  (1.88 m)  Weight:  128.822 kg (284 lb)  SpO2: 97%     Wt Readings from Last 3 Encounters:  03/16/15 128.822 kg (284 lb)  09/01/14 131.498 kg (289 lb 14.4 oz)  09/01/14 129.275 kg (285 lb)    General:  Pleasant white male. Appears calm and comfortable Eyes: PER, normal lids, irises & conjunctiva ENT: grossly normal hearing, lips & tongue Neck: no LAD, masses Cardiovascular: regular rhythm, frequent PVCs on tele monitor. No LE edema.  Respiratory: Respirations even and unlabored. Normal respiratory effort. Lungs CTA bilaterally, no wheezes / rales .   Abdomen: soft, non-distended, non-tender, active bowel sounds. No obvious masses.  Skin: no rash seen on limited exam Musculoskeletal: grossly normal tone BUE/BLE Psychiatric: grossly normal mood and affect, speech fluent and appropriate Neurologic: grossly non-focal.         Labs on Admission:  Basic Metabolic Panel:  Recent Labs Lab 03/16/15 0820  NA 138  K 4.6  CL 105  CO2 25  GLUCOSE 117*  BUN 9  CREATININE 0.79  CALCIUM 9.1   CBC:  Recent Labs Lab 03/16/15 0820  WBC 28.1*  HGB 17.9*  HCT 53.0*  MCV 86.7  PLT 121*    Radiological Exams on Admission: Ct Angio Chest Pe W/cm &/or Wo Cm  03/16/2015  CLINICAL DATA:  Shortness of  breath since yesterday. History of pulmonary embolism and recent cessation of anti coagulation EXAM: CT ANGIOGRAPHY CHEST WITH CONTRAST TECHNIQUE: Multidetector CT imaging of the chest was performed using the standard protocol during bolus administration of intravenous contrast. Multiplanar CT image reconstructions and MIPs were obtained to evaluate the vascular anatomy. CONTRAST:  86mL OMNIPAQUE IOHEXOL 350 MG/ML SOLN COMPARISON:  07/31/2014 FINDINGS: THORACIC INLET/BODY WALL: Unchanged prominence of bilateral hilar lymph nodes. No acute finding. MEDIASTINUM: Cardiomegaly with left ventricular enlargement. Normalized RV to LV ratio at 0.7. No acute aortic findings. Pulmonary artery filling defects beginning in the distal right main pulmonary artery and branching into all lobar arteries and multiple segmental pulmonary arteries. Much of the clot is central and acute appearing. There is also likely residual chronic clot which is marginalized. LUNG WINDOWS: Numerous peripheral or peribronchovascular pulmonary nodules, stable from prior when allowing for motion on the previous study. The largest measure up to 7 mm, including in the right middle lobe on series 407 image 69. Nodules are marked with arrows for follow-up purposes. UPPER ABDOMEN: Partially visualized spleen appears enlarged with  a 17 cm AP span and 6.6 cm thickness. OSSEOUS: No acute fracture.  No suspicious lytic or blastic lesions. Critical Value/emergent results were called by telephone at the time of interpretation on 03/16/2015 at 10:10 am to Dr. Montine Circle , who verbally acknowledged these results. Review of the MIP images confirms the above findings. IMPRESSION: 1. 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 Resolved right ventricular enlargement/strain. 2. Stable axillary and mediastinal adenopathy with probable splenomegaly. If previously recommended lymphoproliferative workup was negative, consider sarcoid. 3.  Stable numerous sub cm pulmonary nodules, likely benign and related to #2. The largest measures up to 7 mm; recommend 1 year follow-up chest CT to prove benignity. Electronically Signed   By: Monte Fantasia M.D.   On: 03/16/2015 10:12   Dg Chest Port 1 View  03/16/2015  CLINICAL DATA:  Shortness of breath and chest pain. Recent pulmonary embolism. EXAM: PORTABLE CHEST 1 VIEW COMPARISON:  07/31/2014 FINDINGS: Borderline cardiomegaly remains stable. Low lung volumes noted. Mild bibasilar pulmonary opacity may be due to atelectasis or infiltrate no evidence of pleural effusion or pneumothorax. IMPRESSION: Mild bibasilar atelectasis versus infiltrates. Electronically Signed   By: Earle Gell M.D.   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 Just completed 6 month course of Xarelto 1 month ago. No right heart strain on CTA this time.  -Admit to telemetry -Obtain Echocardiogram for frequent PVCs seen on telemetry in ED -Will check venous duplex ultrasound of bilateral lower extremities to evaluate for residual,  or new DVTs. -Lovenox per pharmacy   - He is no longer SOB, not hypoxic, will order 02 prn.  -Hematology to see and make recommendation reguarding duration of anticoagulation (? might this be indefinite given CLL). Patient plans to contact his personal Hem/Onc at [facility] upon discharge.    Chronic lymphocytic leukemia, diagnosed late April. Followed at [facility]. No treatment required at this time.   Consultants- Heme/Oncology.   Code Status: full DVT Prophylaxis: on full dose Lovenox Family Communication: Patient alert, oriented and understands plan of care.   Disposition Plan: Discharge to home in 48-72 hours.    Time spent: 60 minutes Tye Savoy  NP [facility] Pager [phone]

## 2015-03-16 NOTE — ED Notes (Signed)
Patient transported to CT 

## 2015-03-16 NOTE — ED Provider Notes (Signed)
CSN: [id number]     Arrival date & time 03/16/15  0810 History   First MD Initiated Contact with Patient 03/16/15 [phone]     Chief Complaint  Patient presents with  . Shortness of Breath     (Consider location/radiation/quality/duration/timing/severity/associated sxs/prior Treatment) HPI Comments: Patient with past medical history of PE and CLL, presents to the emergency department with chief complaint of shortness of breath. He states that he was diagnosed with a pulmonary embolism last March. He took Lovenox 1 month, and then took Xarelto for 5 months. He states that he just finished his Xarelto a few months ago. Patient reports that yesterday he felt some indigestion, and then this morning began to feel shortness of breath. He denies any chest pain or chest pressure. He denies any fevers, cough, or other symptoms. Patient states that he has not had any lower extremity swelling or pain. He does drive approximately 3 hours in one setting one time per week. He wears compression stockings. Patient states that he was also recently diagnosed with  CLL, and is followed by hematology/oncology at [facility]. He states that his counts have been good, and that he is only supposed to follow-up with hematology/oncology on a semi-annual basis.  The history is provided by the patient. No language interpreter was used.    Past Medical History  Diagnosis Date  . Acute meniscal tear of knee LEFT  . PE (pulmonary embolism)    Past Surgical History  Procedure Laterality Date  . Left inguinal hernia repair w/ mesh  06-10-2005  . Fatty cyst removed from flank and back      FLANK- 15 YRS AGO/  BACK 12 YRS AGO  . Knee arthroscopy  07/22/2011    Procedure: ARTHROSCOPY KNEE;  Surgeon: Magnus Sinning, MD;  Location: [facility];  Service: Orthopedics;  Laterality: Left;  Partial medial menisectomy and shaving of the patella   Family History  Problem Relation Age of Onset  . Cancer Father     "in  lymph nodes"   Social History  Substance Use Topics  . Smoking status: Never Smoker   . Smokeless tobacco: Never Used  . Alcohol Use: 3.6 oz/week    6 Shots of liquor per week    Review of Systems  Constitutional: Negative for fever and chills.  Respiratory: Positive for shortness of breath.   Cardiovascular: Negative for chest pain.  Gastrointestinal: Negative for nausea, vomiting, diarrhea and constipation.  Genitourinary: Negative for dysuria.  All other systems reviewed and are negative.     Allergies  Review of patient's allergies indicates no known allergies.  Home Medications   Prior to Admission medications   Medication Sig Start Date End Date Taking? Authorizing Provider  enoxaparin (LOVENOX) 150 MG/ML injection Inject 0.85 mLs (130 mg total) into the skin every 12 (twelve) hours. 08/04/14   Belkys A Regalado, MD  rivaroxaban (XARELTO) 20 MG TABS tablet Take 1 tablet (20 mg total) by mouth daily with supper. 09/01/14   Truitt Merle, MD   BP 173/87 mmHg  Pulse 88  Temp(Src) 98 F (36.7 C)  Resp 20  SpO2 97% Physical Exam  Constitutional: He is oriented to person, place, and time. He appears well-developed and well-nourished.  HENT:  Head: Normocephalic and atraumatic.  Eyes: Conjunctivae and EOM are normal. Pupils are equal, round, and reactive to light. Right eye exhibits no discharge. Left eye exhibits no discharge. No scleral icterus.  Neck: Normal range of motion. Neck supple. No JVD  present.  Cardiovascular: Normal rate, regular rhythm and normal heart sounds.  Exam reveals no gallop and no friction rub.   No murmur heard. Pulmonary/Chest: Effort normal and breath sounds normal. No respiratory distress. He has no wheezes. He has no rales. He exhibits no tenderness.  CTAB  Abdominal: Soft. He exhibits no distension and no mass. There is no tenderness. There is no rebound and no guarding.  Musculoskeletal: Normal range of motion. He exhibits no edema or tenderness.   Neurological: He is alert and oriented to person, place, and time.  Skin: Skin is warm and dry.  Psychiatric: He has a normal mood and affect. His behavior is normal. Judgment and thought content normal.  Nursing note and vitals reviewed.   ED Course  Procedures (including critical care time) Results for orders placed or performed during the hospital encounter of 44/01/02  Basic metabolic panel  Result Value Ref Range   Sodium 138 135 - 145 mmol/L   Potassium 4.6 3.5 - 5.1 mmol/L   Chloride 105 101 - 111 mmol/L   CO2 25 22 - 32 mmol/L   Glucose, Bld 117 (H) 65 - 99 mg/dL   BUN 9 6 - 20 mg/dL   Creatinine, Ser 0.79 0.61 - 1.24 mg/dL   Calcium 9.1 8.9 - 10.3 mg/dL   GFR calc non Af Amer >60 >60 mL/min   GFR calc Af Amer >60 >60 mL/min   Anion gap 8 5 - 15  CBC  Result Value Ref Range   WBC 28.1 (H) 4.0 - 10.5 K/uL   RBC 6.11 (H) 4.22 - 5.81 MIL/uL   Hemoglobin 17.9 (H) 13.0 - 17.0 g/dL   HCT 53.0 (H) 39.0 - 52.0 %   MCV 86.7 78.0 - 100.0 fL   MCH 29.3 26.0 - 34.0 pg   MCHC 33.8 30.0 - 36.0 g/dL   RDW 17.2 (H) 11.5 - 15.5 %   Platelets 121 (L) 150 - 400 K/uL  I-stat troponin, ED  Result Value Ref Range   Troponin i, poc 0.00 0.00 - 0.08 ng/mL   Comment 3           Ct Angio Chest Pe W/cm &/or Wo Cm  03/16/2015  CLINICAL DATA:  Shortness of breath since yesterday. History of pulmonary embolism and recent cessation of anti coagulation EXAM: CT ANGIOGRAPHY CHEST WITH CONTRAST TECHNIQUE: Multidetector CT imaging of the chest was performed using the standard protocol during bolus administration of intravenous contrast. Multiplanar CT image reconstructions and MIPs were obtained to evaluate the vascular anatomy. CONTRAST:  23mL OMNIPAQUE IOHEXOL 350 MG/ML SOLN COMPARISON:  07/31/2014 FINDINGS: THORACIC INLET/BODY WALL: Unchanged prominence of bilateral hilar lymph nodes. No acute finding. MEDIASTINUM: Cardiomegaly with left ventricular enlargement. Normalized RV to LV ratio at 0.7. No  acute aortic findings. Pulmonary artery filling defects beginning in the distal right main pulmonary artery and branching into all lobar arteries and multiple segmental pulmonary arteries. Much of the clot is central and acute appearing. There is also likely residual chronic clot which is marginalized. LUNG WINDOWS: Numerous peripheral or peribronchovascular pulmonary nodules, stable from prior when allowing for motion on the previous study. The largest measure up to 7 mm, including in the right middle lobe on series 407 image 69. Nodules are marked with arrows for follow-up purposes. UPPER ABDOMEN: Partially visualized spleen appears enlarged with a 17 cm AP span and 6.6 cm thickness. OSSEOUS: No acute fracture.  No suspicious lytic or blastic lesions. Critical Value/emergent results were called by  telephone at the time of interpretation on 03/16/2015 at 10:10 am to Dr. Montine Circle , who verbally acknowledged these results. Review of the MIP images confirms the above findings. IMPRESSION: 1. 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 Resolved right ventricular enlargement/strain. 2. Stable axillary and mediastinal adenopathy with probable splenomegaly. If previously recommended lymphoproliferative workup was negative, consider sarcoid. 3. Stable numerous sub cm pulmonary nodules, likely benign and related to #2. The largest measures up to 7 mm; recommend 1 year follow-up chest CT to prove benignity. Electronically Signed   By: Monte Fantasia M.D.   On: 03/16/2015 10:12   Dg Chest Port 1 View  03/16/2015  CLINICAL DATA:  Shortness of breath and chest pain. Recent pulmonary embolism. EXAM: PORTABLE CHEST 1 VIEW COMPARISON:  07/31/2014 FINDINGS: Borderline cardiomegaly remains stable. Low lung volumes noted. Mild bibasilar pulmonary opacity may be due to atelectasis or infiltrate no evidence of pleural effusion or pneumothorax. IMPRESSION: Mild bibasilar atelectasis versus  infiltrates. Electronically Signed   By: Earle Gell M.D.   On: 03/16/2015 08:57     I have personally reviewed and evaluated these images and lab results as part of my medical decision-making.   EKG Interpretation   Date/Time:  Monday March 16 2015 08:17:17 EDT Ventricular Rate:  87 PR Interval:  219 QRS Duration: 109 QT Interval:  402 QTC Calculation: 484 R Axis:   -63 Text Interpretation:  Sinus arrhythmia Multiform ventricular premature  complexes Prolonged PR interval LAD, consider LAFB or inferior infarct  Borderline low voltage, extremity leads Borderline prolonged QT interval  Baseline wander in lead(s) II III aVF V1 V2 V3 No significant change since  last tracing bigeminy resolved Confirmed by KNAPP  MD-J, JON ([id number]) on  03/16/2015 8:22:05 AM      MDM   Final diagnoses:  PE (pulmonary embolism)    Patient with prior hx of PE.  Recently discontinued anticoagulation therapy. Reports new shortness of breath symptoms this morning. Will check CT angiogram. Patient discussed with Dr. Tomi Bamberger, who agrees with the plan.  Patient given aspirin.  EKG unchanged from priors.  10:24 AM CT scan remarkable for a new acute on chronic pulmonary embolism. No CT evidence of right heart strain. Patient will need anticoagulation. Discussed with Dr. Tomi Bamberger, who agrees with plan. Will start on Lovenox and admitted to hospitalist service.  Appreciate hospitalist team for giving the patient. Lovenox ordered an emergency department. Patient understands and agrees with plan for admission. He is currently very stable appearing.  Medications  enoxaparin (LOVENOX) injection 130 mg (not administered)  aspirin chewable tablet 324 mg (243 mg Oral Given 03/16/15 0836)  iohexol (OMNIPAQUE) 350 MG/ML injection 80 mL (80 mLs Intravenous Contrast Given 03/16/15 0942)   CRITICAL CARE Performed by: Montine Circle   Total critical care time: 45  Critical care time was exclusive of separately  billable procedures and treating other patients.  Critical care was necessary to treat or prevent imminent or life-threatening deterioration.  Critical care was time spent personally by me on the following activities: development of treatment plan with patient and/or surrogate as well as nursing, discussions with consultants, evaluation of patient's response to treatment, examination of patient, obtaining history from patient or surrogate, ordering and performing treatments and interventions, ordering and review of laboratory studies, ordering and review of radiographic studies, pulse oximetry and re-evaluation of patient's condition.     Montine Circle, PA-C 03/16/15 1046  Dorie Rank, MD 03/16/15 [phone]

## 2015-03-16 NOTE — ED Notes (Signed)
Admitting MD at bedside.

## 2015-03-17 ENCOUNTER — Encounter (HOSPITAL_COMMUNITY): Payer: Self-pay | Admitting: Physician Assistant

## 2015-03-17 ENCOUNTER — Ambulatory Visit (HOSPITAL_COMMUNITY): Payer: Commercial Managed Care - PPO

## 2015-03-17 DIAGNOSIS — R918 Other nonspecific abnormal finding of lung field: Secondary | ICD-10-CM

## 2015-03-17 DIAGNOSIS — Z86718 Personal history of other venous thrombosis and embolism: Secondary | ICD-10-CM

## 2015-03-17 DIAGNOSIS — I429 Cardiomyopathy, unspecified: Secondary | ICD-10-CM

## 2015-03-17 DIAGNOSIS — I498 Other specified cardiac arrhythmias: Secondary | ICD-10-CM

## 2015-03-17 DIAGNOSIS — I442 Atrioventricular block, complete: Secondary | ICD-10-CM

## 2015-03-17 DIAGNOSIS — I2699 Other pulmonary embolism without acute cor pulmonale: Secondary | ICD-10-CM

## 2015-03-17 DIAGNOSIS — R739 Hyperglycemia, unspecified: Secondary | ICD-10-CM

## 2015-03-17 DIAGNOSIS — D751 Secondary polycythemia: Secondary | ICD-10-CM

## 2015-03-17 DIAGNOSIS — I493 Ventricular premature depolarization: Secondary | ICD-10-CM

## 2015-03-17 DIAGNOSIS — R0902 Hypoxemia: Secondary | ICD-10-CM

## 2015-03-17 DIAGNOSIS — R599 Enlarged lymph nodes, unspecified: Secondary | ICD-10-CM

## 2015-03-17 DIAGNOSIS — Z789 Other specified health status: Secondary | ICD-10-CM

## 2015-03-17 HISTORY — DX: Other pulmonary embolism without acute cor pulmonale: I26.99

## 2015-03-17 LAB — T4, FREE: Free T4: 1 ng/dL (ref 0.61–1.12)

## 2015-03-17 LAB — TSH: TSH: 4.114 u[IU]/mL (ref 0.350–4.500)

## 2015-03-17 LAB — C-REACTIVE PROTEIN: CRP: 0.7 mg/dL (ref <1.0)

## 2015-03-17 LAB — MAGNESIUM: Magnesium: 2.2 mg/dL (ref 1.7–2.4)

## 2015-03-17 LAB — SEDIMENTATION RATE: SED RATE: 1 mm/h (ref 0–16)

## 2015-03-17 MED ORDER — METOPROLOL SUCCINATE ER 25 MG PO TB24
25.0000 mg | ORAL_TABLET | Freq: Every day | ORAL | Status: DC
  Administered 2015-03-17 – 2015-03-18 (×2): 25 mg via ORAL
  Filled 2015-03-17 (×2): qty 1

## 2015-03-17 MED ORDER — HEPARIN (PORCINE) IN NACL 100-0.45 UNIT/ML-% IJ SOLN
1900.0000 [IU]/h | INTRAMUSCULAR | Status: AC
  Administered 2015-03-17 – 2015-03-18 (×2): 1900 [IU]/h via INTRAVENOUS
  Filled 2015-03-17 (×2): qty 250

## 2015-03-17 MED ORDER — LIVING BETTER WITH HEART FAILURE BOOK
Freq: Once | Status: AC
  Administered 2015-03-17: 16:00:00

## 2015-03-17 NOTE — Progress Notes (Signed)
PROGRESS NOTE  Ross [name] XBD:[id number] DOB: 01-06-1956 DOA: 03/16/2015 PCP: Harle Battiest, MD  59 year old male with a history of left lower extremity DVT and PE (March 2016), CLL, pulmonary nodules presented with one-day history of lightheadedness and shortness of breath. The patient recently finished a 6 month course of rivaroxaban for his thromboembolism. He follows Dr. Leretha Pol at [facility] for his hematology needs. The patient denies any recent surgery, medication changes, trauma, or prolonged car rides or plane flights. On 03/15/2015, the patient initially felt some gas and bloating in his abdomen. On the morning of 03/16/2015, the patient began having some lightheadedness and shortness of breath. She was INITIALLY emergency department for further evaluation. CT antrum of the chest at that time revealed acute pulmonary embolus without any evidence of right ventricular strain. Venous duplex of his lower extremity was performed and showed an indeterminate age left lower extremity clot without any acute DVT. The patient remained hemodynamically stable without any hypoxemia. The patient was not tachycardic. Symptomatically, he was improved. The patient was seen by hematology who recommended Lovenox for 1 month and then transition to rivaroxaban. Echocardiogram was performed.  Assessment/Plan: Acute pulmonary embolus -The patient remained hemodynamically stable without any hypoxemia -Venous duplex of the left lower extremity revealed indeterminate age clot in the mid to distal femoral vein and popliteal vein. There was no acute DVT -The patient was seen by hematology who recommended a one-month of Lovenox and transitioning to rivaroxaban thereafter indefinitely -Echo -The patient was observed for 24-48 hours without any incidence. -Given the patient's recurrence, he will need indefinite anticoagulation Suppressed EF/Wall motion abnormality -likely due to PE -Echo 40-45%, grade 1 DD,  dilated RV with mod reduced function, IVC dilated -consulted pulmonary and cardiology -switch to heparin IV Thrombocytopenia -Likely secondary to his acute thromboembolism with possible contribution of his CLL -This is monitored by his hematologist at [facility], Dr. Leretha Pol -No signs of active bleeding  Pulmonary nodules -CT and grandmother chest revealed that his pulmonary nodule size has remained stable with largest nodule in the right middle lobe measuring 7 mm -He has stable mediastinal lymphadenopathy -He will need continued outpatient surveillance CLL/SLL -followed at [facility] as discussed above    Family Communication:   Pt at beside Disposition Plan:   Home when medically stable     Procedures/Studies: Ct Angio Chest Pe W/cm &/or Wo Cm  03/16/2015  CLINICAL DATA:  Shortness of breath since yesterday. History of pulmonary embolism and recent cessation of anti coagulation EXAM: CT ANGIOGRAPHY CHEST WITH CONTRAST TECHNIQUE: Multidetector CT imaging of the chest was performed using the standard protocol during bolus administration of intravenous contrast. Multiplanar CT image reconstructions and MIPs were obtained to evaluate the vascular anatomy. CONTRAST:  1mL OMNIPAQUE IOHEXOL 350 MG/ML SOLN COMPARISON:  07/31/2014 FINDINGS: THORACIC INLET/BODY WALL: Unchanged prominence of bilateral hilar lymph nodes. No acute finding. MEDIASTINUM: Cardiomegaly with left ventricular enlargement. Normalized RV to LV ratio at 0.7. No acute aortic findings. Pulmonary artery filling defects beginning in the distal right main pulmonary artery and branching into all lobar arteries and multiple segmental pulmonary arteries. Much of the clot is central and acute appearing. There is also likely residual chronic clot which is marginalized. LUNG WINDOWS: Numerous peripheral or peribronchovascular pulmonary nodules, stable from prior when allowing for motion on the previous study. The largest measure up to 7 mm,  including in the right middle lobe on series 407 image 69. Nodules are marked with arrows  for follow-up purposes. UPPER ABDOMEN: Partially visualized spleen appears enlarged with a 17 cm AP span and 6.6 cm thickness. OSSEOUS: No acute fracture.  No suspicious lytic or blastic lesions. Critical Value/emergent results were called by telephone at the time of interpretation on 03/16/2015 at 10:10 am to Dr. Montine Circle , who verbally acknowledged these results. Review of the MIP images confirms the above findings. IMPRESSION: 1. 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 Resolved right ventricular enlargement/strain. 2. Stable axillary and mediastinal adenopathy with probable splenomegaly. If previously recommended lymphoproliferative workup was negative, consider sarcoid. 3. Stable numerous sub cm pulmonary nodules, likely benign and related to #2. The largest measures up to 7 mm; recommend 1 year follow-up chest CT to prove benignity. Electronically Signed   By: Monte Fantasia M.D.   On: 03/16/2015 10:12   Dg Chest Port 1 View  03/16/2015  CLINICAL DATA:  Shortness of breath and chest pain. Recent pulmonary embolism. EXAM: PORTABLE CHEST 1 VIEW COMPARISON:  07/31/2014 FINDINGS: Borderline cardiomegaly remains stable. Low lung volumes noted. Mild bibasilar pulmonary opacity may be due to atelectasis or infiltrate no evidence of pleural effusion or pneumothorax. IMPRESSION: Mild bibasilar atelectasis versus infiltrates. Electronically Signed   By: Earle Gell M.D.   On: 03/16/2015 08:57         Subjective: Patient denies fevers, chills, headache, chest pain, dyspnea, nausea, vomiting, diarrhea, abdominal pain, dysuria, hematuria   Objective: Filed Vitals:   03/16/15 2203 03/17/15 0058 03/17/15 0437 03/17/15 0846  BP: 130/83 126/67 137/87 116/91  Pulse: 72 77 79 79  Temp: 97.9 F (36.6 C) 98.1 F (36.7 C) 97.5 F (36.4 C) 98.6 F (37 C)  TempSrc: Oral Oral Oral  Oral  Resp: 20 19 20 21   Height:      Weight:   129.321 kg (285 lb 1.6 oz)   SpO2: 93% 94% 96% 100%    Intake/Output Summary (Last 24 hours) at 03/17/15 1453 Last data filed at 03/17/15 1300  Gross per 24 hour  Intake    460 ml  Output    500 ml  Net    -40 ml   Weight change:  Exam:   General:  Pt is alert, follows commands appropriately, not in acute distress  HEENT: No icterus, No thrush, No neck mass, /AT  Cardiovascular: RRR, S1/S2, no rubs, no gallops  Respiratory: CTA bilaterally, no wheezing, no crackles, no rhonchi  Abdomen: Soft/+BS, non tender, non distended, no guarding; no hepatosplenomegaly  Extremities: 1+ LLE>RLE edema, No lymphangitis, No petechiae, No rashes, no synovitis; no cyanosis or clubbing  Data Reviewed: Basic Metabolic Panel:  Recent Labs Lab 03/16/15 0820  NA 138  K 4.6  CL 105  CO2 25  GLUCOSE 117*  BUN 9  CREATININE 0.79  CALCIUM 9.1   Liver Function Tests: No results for input(s): AST, ALT, ALKPHOS, BILITOT, PROT, ALBUMIN in the last 168 hours. No results for input(s): LIPASE, AMYLASE in the last 168 hours. No results for input(s): AMMONIA in the last 168 hours. CBC:  Recent Labs Lab 03/16/15 0820  WBC 28.1*  HGB 17.9*  HCT 53.0*  MCV 86.7  PLT 121*   Cardiac Enzymes: No results for input(s): CKTOTAL, CKMB, CKMBINDEX, TROPONINI in the last 168 hours. BNP: Invalid input(s): POCBNP CBG: No results for input(s): GLUCAP in the last 168 hours.  No results found for this or any previous visit (from the past 240 hour(s)).   Scheduled Meds: . sodium chloride  3 mL Intravenous Q12H   Continuous Infusions:    Lillyan Hitson, DO  [facility] Pager [phone]  If 7PM-7AM, please contact night-coverage [email or web address] Password [email or web address] 03/17/2015, 2:53 PM   LOS: 1 day

## 2015-03-17 NOTE — Consult Note (Signed)
Cardiology Consultation Note  Patient ID: [name], MRN: [id number], DOB/AGE: November 24, 1955 59 y.o. Admit date: 03/16/2015   Date of Consult: 03/17/2015 Primary Physician: Ross Battiest, MD Primary Cardiologist: New to Dr. Acie [name]  Chief Complaint: SOB, just didn't feel right Reason for Consultation: EF now down to 40-45%  HPI: Ross [name] is a 59 y/o gentleman with history of PE/DVT in 07/2014 which ultimately led to a diagnosis of CLL/SLL, PVCs, and habitual alcohol use (2 drinks+ daily) who presented to [facility] 03/16/2015 with dyspnea and recurrent PE.  Previously in March 2016, he was found to have multiple filling defects by CT bilaterally along with right heart strain, multiple mildly enlarged mediastinal lymph notes and bilateral axillary lymph notes. 2D echo at that time (08/01/14) showed mod LVH, EF 50-55%, no RWMA, mod LAE/RAE, moderately dilated RV with mild-mod reduced systolic function. EKG at that time notable for PVCs in bigeminal fashion. In the setting of leukocytosis and adenopathy he went on to have eval at [facility] with subsequent diagnoses of CLL/SLL. He was treated with Lovenox x 1 month then transitioned to 5 months of Xarelto. He completed the 6 months of planned anticoagulation in September, about 3-4 weeks ago. On Sunday 03/15/15, he began to feel a sensation of congestion/bloating in setting of no BM for several days - this improved with a large belch and BM. However, on Monday morning while at work 03/16/15, he began to notice some lightheadedness and dizziness similar to when he had his PE in March. He proceeded directly to the ER where CTA showed acute on chronic PE on the right with large clot burden, resolved RV strain, stable axillary/mediastinal adenopathy with probable splenomegaly, stable numerous subcm pulm nodules. LE duplex 03/17/15: left nonocclusive DVT of indeterminant age. Labwork notable for WBC 28k, plt 121, troponin neg x1, BMET ok except glucose  117. Heme-onc has recommended another round of Lovenox with eventual transition to Xarelto indefinitely. He denies any history of CP. He denies any weight gain, LEE, orthopnea. He generally follows a healthy diet and does not consume excess salt. No history of known HTN, HLD, tobacco abuse, or family history of CAD - mom is living level at 3, dad died in late 33s of cancer, and siblings are in good health.   Cardiac workup this admission included a repeat 2D echo 03/17/15 showing EF 40-45% with diffuse HK, grade 1 DD, no evidence of elevated LV filling pressure, mild MR/TR, mod dilated LA, mod dilated RV with mod reduced systolic function, IVC dilated with elevated CVP. Telemetry shows frequent PVCs (multifocal) with brief run of AIVR. He denies any awareness of these, and has no history of syncope. He denies prior cardiac workup except noted above. He does recall his PCP mentioning occasional PVCs in the past. He reports a full physical at age 29 for history of a heart murmur but nothing ever came of this.  Past Medical History  Diagnosis Date  . Acute meniscal tear of knee LEFT  . PE (pulmonary embolism)     a. Multiple bilateral PEs on CT 07/2014. Initially tx with Lovenox/Xarelto x 6 months, but readmitted 02/2015 with recurrent acute on chronic PE.  Marland Kitchen CLL (chronic lymphocytic leukemia) ([place]) 03/16/2015    a. CLL/SLL followed at [facility].  . DVT of leg (deep venous thrombosis) ([place])   . Habitual alcohol use   . PVC's (premature ventricular contractions)       Most Recent Cardiac Studies: 2D echo 03/17/15 - Left ventricle: The  cavity size was normal. Systolic function was mildly to moderately reduced. The estimated ejection fraction was in the range of 40% to 45%. Diffuse hypokinesis. Doppler parameters are consistent with abnormal left ventricular relaxation (grade 1 diastolic dysfunction). There was no evidence of elevated ventricular filling pressure by Doppler parameters. -  Aortic valve: Trileaflet; normal thickness leaflets. There was no regurgitation. - Aortic root: The aortic root was normal in size. - Mitral valve: Structurally normal valve. There was mild regurgitation. - Left atrium: The atrium was moderately dilated. - Right ventricle: The cavity size was moderately dilated. Wall thickness was normal. Systolic function was moderately reduced. - Tricuspid valve: There was mild regurgitation. - Pulmonic valve: Structurally normal valve. - Pulmonary arteries: Systolic pressure was within the normal range. - Inferior vena cava: The vessel was dilated. The respirophasic diameter changes were blunted (< 50%), consistent with elevated central venous pressure. - Pericardium, extracardiac: There was no pericardial effusion.  Impressions:  - When compared to the prior exam on 08/01/2014 LVEF is now lower estimated at 40-45%, previously 50-55%.   Surgical History:  Past Surgical History  Procedure Laterality Date  . Left inguinal hernia repair w/ mesh  06-10-2005  . Fatty cyst removed from flank and back      FLANK- 15 YRS AGO/  BACK 12 YRS AGO  . Knee arthroscopy  07/22/2011    Procedure: ARTHROSCOPY KNEE;  Surgeon: Ross Sinning, MD;  Location: [facility];  Service: Orthopedics;  Laterality: Left;  Partial medial menisectomy and shaving of the patella  . Tonsillectomy       Home Meds: Prior to Admission medications   Medication Sig Start Date End Date Taking? Authorizing Provider  enoxaparin (LOVENOX) 150 MG/ML injection Inject 0.85 mLs (130 mg total) into the skin every 12 (twelve) hours. Patient not taking: Reported on 03/16/2015 08/04/14   Ross A Regalado, MD  rivaroxaban (XARELTO) 20 MG TABS tablet Take 1 tablet (20 mg total) by mouth daily with supper. Patient not taking: Reported on 03/16/2015 09/01/14   Ross Merle, MD    Inpatient Medications:  . sodium chloride  3 mL Intravenous Q12H   . heparin       Allergies: No Known Allergies  Social History   Social History  . Marital Status: Married    Spouse Name: N/A  . Number of Children: N/A  . Years of Education: N/A   Occupational History  . Not on file.   Social History Main Topics  . Smoking status: Never Smoker   . Smokeless tobacco: Never Used  . Alcohol Use: Yes     Comment: Drinks 2 mixed drinks per day, more during social events.  . Drug Use: No  . Sexual Activity: Not on file   Other Topics Concern  . Not on file   Social History Narrative     Family History  Problem Relation Age of Onset  . Cancer Father     "in lymph nodes"  . CAD Neg Hx      Review of Systems: Denies bleeding issues. No syncope. All other systems reviewed and are otherwise negative except as noted above.  Labs:  Lab Results  Component Value Date   WBC 28.1* 03/16/2015   HGB 17.9* 03/16/2015   HCT 53.0* 03/16/2015   MCV 86.7 03/16/2015   PLT 121* 03/16/2015    Recent Labs Lab 03/16/15 0820  NA 138  K 4.6  CL 105  CO2 25  BUN 9  CREATININE 0.79  CALCIUM 9.1  GLUCOSE 117*    Lab Results  Component Value Date   DDIMER 7.27* 07/31/2014    Radiology/Studies:  Ct Angio Chest Pe W/cm &/or Wo Cm  03/16/2015  CLINICAL DATA:  Shortness of breath since yesterday. History of pulmonary embolism and recent cessation of anti coagulation EXAM: CT ANGIOGRAPHY CHEST WITH CONTRAST TECHNIQUE: Multidetector CT imaging of the chest was performed using the standard protocol during bolus administration of intravenous contrast. Multiplanar CT image reconstructions and MIPs were obtained to evaluate the vascular anatomy. CONTRAST:  50mL OMNIPAQUE IOHEXOL 350 MG/ML SOLN COMPARISON:  07/31/2014 FINDINGS: THORACIC INLET/BODY WALL: Unchanged prominence of bilateral hilar lymph nodes. No acute finding. MEDIASTINUM: Cardiomegaly with left ventricular enlargement. Normalized RV to LV ratio at 0.7. No acute aortic findings. Pulmonary artery filling  defects beginning in the distal right main pulmonary artery and branching into all lobar arteries and multiple segmental pulmonary arteries. Much of the clot is central and acute appearing. There is also likely residual chronic clot which is marginalized. LUNG WINDOWS: Numerous peripheral or peribronchovascular pulmonary nodules, stable from prior when allowing for motion on the previous study. The largest measure up to 7 mm, including in the right middle lobe on series 407 image 69. Nodules are marked with arrows for follow-up purposes. UPPER ABDOMEN: Partially visualized spleen appears enlarged with a 17 cm AP span and 6.6 cm thickness. OSSEOUS: No acute fracture.  No suspicious lytic or blastic lesions. Critical Value/emergent results were called by telephone at the time of interpretation on 03/16/2015 at 10:10 am to Dr. Montine Circle , who verbally acknowledged these results. Review of the MIP images confirms the above findings. IMPRESSION: 1. 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 Resolved right ventricular enlargement/strain. 2. Stable axillary and mediastinal adenopathy with probable splenomegaly. If previously recommended lymphoproliferative workup was negative, consider sarcoid. 3. Stable numerous sub cm pulmonary nodules, likely benign and related to #2. The largest measures up to 7 mm; recommend 1 year follow-up chest CT to prove benignity. Electronically Signed   By: Monte Fantasia M.D.   On: 03/16/2015 10:12   Dg Chest Port 1 View  03/16/2015  CLINICAL DATA:  Shortness of breath and chest pain. Recent pulmonary embolism. EXAM: PORTABLE CHEST 1 VIEW COMPARISON:  07/31/2014 FINDINGS: Borderline cardiomegaly remains stable. Low lung volumes noted. Mild bibasilar pulmonary opacity may be due to atelectasis or infiltrate no evidence of pleural effusion or pneumothorax. IMPRESSION: Mild bibasilar atelectasis versus infiltrates. Electronically Signed   By: Earle Gell  M.D.   On: 03/16/2015 08:57    Wt Readings from Last 3 Encounters:  03/17/15 285 lb 1.6 oz (129.321 kg)  09/01/14 289 lb 14.4 oz (131.498 kg)  09/01/14 285 lb (129.275 kg)    EKG: NSR 87bpm, occasional PVCs, borderline prolonged PR interval, LAFB, baseline wander, nonspecific ST changes.  Physical Exam: Blood pressure 116/91, pulse 79, temperature 98.6 F (37 C), temperature source Oral, resp. rate 21, height 6\' 2"  (1.88 m), weight 285 lb 1.6 oz (129.321 kg), SpO2 100 %. General: Well developed, well nourished WM, in no acute distress. Head: Normocephalic, atraumatic, sclera non-icteric, no xanthomas, nares are without discharge.  Neck: Negative for carotid bruits. JVD not elevated. Lungs: Clear bilaterally to auscultation without wheezes, rales, or rhonchi. Breathing is unlabored. Heart: RRR with normal S1, S2 but occasional ectopy noted. No murmurs, rubs, or gallops appreciated. Abdomen: Soft, non-tender, non-distended with normoactive bowel sounds. No hepatomegaly. No rebound/guarding. No obvious abdominal masses.  Msk:  Strength and tone appear normal for age. Extremities: No clubbing or cyanosis. No edema.  Distal pedal pulses are 2+ and equal bilaterally. Neuro: Alert and oriented X 3. No facial asymmetry. No focal deficit. Moves all extremities spontaneously. Psych:  Responds to questions appropriately with a normal affect.    Assessment and Plan:   1. Recurrent PE/DVT in the setting of CLL/SLL - per heme-onc/IM.  2. Newly recognized cardiomyopathy EF 40-45%, continued RV dysfunction - etiology of LV dysfunction not totally clear. Question related to frequent PVCs versus EtOH. Start Toprol XL 25mg  daily and observe on telemetry overnight. Hold off on starting ACEI at this time while we titrate BB as tolerated since his BP is not very high. Although he has poverty of risk factors for CAD, would benefit from ischemic testing since this has never been done - per discussion with MD we  can do this as an outpatient. Check labs as below, including lipid panel in AM for risk stratify. He has no evidence of volume overload on board. Discussed low sodium diet, daily weights, and observation for symptoms of CHF. Will rx CHF book for further education.   3. Frequent PVCs and brief AIVR - check Mg, TSH. See above.  4. Habitual alcohol use - we discussed possible role in cardiomyopathy as well as dangers of drinking while on blood thinner. He says he quit while on anticoagulation before, and feels he can do so again.  5. Hyperglycemia - check A1C for risk stratification.  SignedMelina Copa PA-C 03/17/2015, 3:31 PM Pager: [phone]  Attending Note:   The patient was seen and examined.  Agree with assessment and plan as noted above.  Changes made to the above note as needed.  I have personally reviewed the recent echo and the previous echo and agree that the LV function appears to be a bit lower in the current echol Would suggest a myvoiew as op  He has lots of PVCs and this may be the etiology of his mild LV dysfunction  We can consider getting a 24 hr holter to evaluate his PVC burden.    He may be able to go home tomorrow.     Thayer Headings, Brooke Bonito., MD, [facility] 03/17/2015, 8:05 PM [address]  [place] Pager [phone]

## 2015-03-17 NOTE — Progress Notes (Signed)
ANTICOAGULATION CONSULT NOTE - Initial Consult  Pharmacy Consult for Heparin Indication: PE  No Known Allergies  Patient Measurements: Height: 6\' 2"  (188 cm) Weight: 285 lb 1.6 oz (129.321 kg) IBW/kg (Calculated) : 82.2 Heparin Dosing Weight: 110 kg  Vital Signs: Temp: 98.6 F (37 C) (10/18 0846) Temp Source: Oral (10/18 0846) BP: 116/91 mmHg (10/18 0846) Pulse Rate: 79 (10/18 0846)  Labs:  Recent Labs  03/16/15 0820  HGB 17.9*  HCT 53.0*  PLT 121*  CREATININE 0.79    Estimated Creatinine Clearance: 143.8 mL/min (by C-G formula based on Cr of 0.79).   Medical History: Past Medical History  Diagnosis Date  . Acute meniscal tear of knee LEFT  . PE (pulmonary embolism)   . CLL (chronic lymphocytic leukemia) ([place]) 03/16/2015  . DVT of leg (deep venous thrombosis) [facility])     Assessment: 59 yo M presents on 10/17 with SOB. Found to have acute on chronic PE / LLE DVT. Was started on enoxaparin but now to switch to IV heparin after pulm and cards consult. Last dose of enoxaparin was at 1100 today. Hgb slightly high at 17.9, plts 121. No s/s of bleed.  Goal of Therapy:  Heparin level 0.3-0.7 units/ml Monitor platelets by anticoagulation protocol: Yes   Plan:  Stop enoxaparin No heparin BOLUS Start heparin gtt at 1900 units/hr at 2200 tonight Check 6 hr HL Monitor daily HL, CBC, s/s of bleed  Tadeusz Stahl J 03/17/2015,3:10 PM

## 2015-03-17 NOTE — Progress Notes (Signed)
Utilization review completed. Evy Lutterman, RN, BSN. 

## 2015-03-17 NOTE — Progress Notes (Signed)
VASCULAR LAB PRELIMINARY  PRELIMINARY  PRELIMINARY  PRELIMINARY  Bilateral lower extremity venous duplex  completed.    Preliminary report:  Right:  No evidence of DVT, superficial thrombosis, or Baker's cyst.  Left: Non occlusive DVT of indeterminate age noted in the mid to distal FV, popliteal v, PTV.   This may represent resolving DVT noted in March 2016.  No evidence of acute DVT.  No evidence of superficial thrombosis.  No Baker's cyst.   Ki Corbo, RVT 03/17/2015, 1:12 PM

## 2015-03-17 NOTE — Progress Notes (Signed)
  Echocardiogram 2D Echocardiogram has been performed.  Darlina Sicilian M 03/17/2015, 12:24 PM

## 2015-03-17 NOTE — Discharge Summary (Addendum)
Physician Discharge Summary  SHELTON SQUARE CVE:[id number] DOB: [birth year] DOA: 03/16/2015  PCP: Harle Battiest, MD  Admit date: 03/16/2015 Discharge date: 03/18/15 Recommendations for Outpatient Follow-up:  1. Pt will need to follow up with PCP in 2 weeks post discharge   Discharge Diagnoses:   Acute pulmonary embolus -The patient remained hemodynamically stable without any hypoxemia -Venous duplex of the left lower extremity revealed indeterminate age clot in the mid to distal femoral vein and popliteal vein. There was no acute DVT -The patient was seen by hematology who recommended a one-month of Lovenox and transitioning to rivaroxaban thereafter indefinitely -Echo---Echo 40-45%, grade 1 DD, dilated RV with mod reduced function, IVC dilated -appreciate pulmonary consult -patient was observed for 48 hours without any incidence. -Given the patient's recurrence, he will need indefinite anticoagulation -per hematology recommendations--home with lovenox 130mg /kg q 12 hours x 30 days, then switch to rivaroxaban indefinitely -On room air, the patient maintain oxygen saturation 95-96 percent. With ambulation nausea saturation was 90-91 percent on RA Suppressed EF/Wall motion abnormality/new Cardiomyopathy -likely due to PE in combination with habitual alcohol use -Echo 40-45%, grade 1 DD, dilated RV with mod reduced function, IVC dilated -consulted cardiology--home with metoprolol succinate 50 mg daily -ultimately need outpatient Myoview  -repeat echo in 3 to 6 months -switch to heparin IV-->pt to go home with lovenox 1mg /kg bid x 30 days, then start Xarelto per hematology recommendations Thrombocytopenia -Likely secondary to his acute thromboembolism with possible contribution of his CLL -This is monitored by his hematologist at [facility], Dr. Leretha Pol -No signs of active bleeding Pulmonary nodules -CT and grandmother chest revealed that his pulmonary nodule size has remained stable with  largest nodule in the right middle lobe measuring 7 mm -He has stable mediastinal lymphadenopathy -He will need continued outpatient surveillance CLL/SLL -followed at [facility] as discussed above  Discharge Condition: Stable  Disposition:      Follow-up Information    Follow up with [facility] On 03/25/2015.   Specialty:  Pulmonology   Why:  @ 4:00 PM with Dr. Melvyn Novas. Confirmed appointmen   Contact information:   [place] [phone]     home  Diet: Heart healthy Wt Readings from Last 3 Encounters:  03/18/15 129.475 kg (285 lb 7 oz)  09/01/14 131.498 kg (289 lb 14.4 oz)  09/01/14 129.275 kg (285 lb)    History of present illness:  59 year old male with a history of left lower extremity DVT and PE (March 2016), CLL, pulmonary nodules presented with one-day history of lightheadedness and shortness of breath. The patient recently finished a 6 month course of rivaroxaban for his thromboembolism. He follows Dr. Leretha Pol at [facility] for his hematology needs. The patient denies any recent surgery, medication changes, trauma, or prolonged car rides or plane flights. On 03/15/2015, the patient initially felt some gas and bloating in his abdomen. On the morning of 03/16/2015, the patient began having some lightheadedness and shortness of breath. She was INITIALLY emergency department for further evaluation. CT antrum of the chest at that time revealed acute pulmonary embolus without any evidence of right ventricular strain. Venous duplex of his lower extremity was performed and showed an indeterminate age left lower extremity clot without any acute DVT. The patient remained hemodynamically stable without any hypoxemia. The patient was not tachycardic. Symptomatically, he was improved. The patient was seen by hematology who recommended Lovenox for 1 month and then transition to rivaroxaban.   Echocardiogram was performed-- 40-45%, grade 1 DD,  dilated RV  with mod reduced function, IVC dilated.  Cardiology was consulted, and metoprolol succinate 25 mg daily was added.  Consultants: Hematology--Dr. Truitt Merle Cardiology pulmonology  Discharge Exam: Filed Vitals:   03/18/15 1211  BP: 130/88  Pulse: 71  Temp: 98.3 F (36.8 C)  Resp: 20   Filed Vitals:   03/17/15 2138 03/18/15 0152 03/18/15 0512 03/18/15 1211  BP: 122/81 114/84 124/87 130/88  Pulse: 75 78 78 71  Temp: 97.7 F (36.5 C) 97.4 F (36.3 C) 98 F (36.7 C) 98.3 F (36.8 C)  TempSrc: Oral Oral Oral Oral  Resp: 20 20 20 20   Height:      Weight:   129.475 kg (285 lb 7 oz)   SpO2: 94% 96% 95% 94%   General: A&O x 3, NAD, pleasant, cooperative Cardiovascular: RRR, no rub, no gallop, no S3 Respiratory: CTAB, no wheeze, no rhonchi Abdomen:soft, nontender, nondistended, positive bowel sounds Extremities: 1+ LLE>RLE edema, No lymphangitis, no petechiae  Discharge Instructions     Medication List    STOP taking these medications        rivaroxaban 20 MG Tabs tablet  Commonly known as:  XARELTO  Replaced by:  Rivaroxaban 15 & 20 MG Tbpk      TAKE these medications        enoxaparin 150 MG/ML injection  Commonly known as:  LOVENOX  Inject 0.85 mLs (130 mg total) into the skin every 12 (twelve) hours.  Start taking on:  03/19/2015     metoprolol succinate 50 MG 24 hr tablet  Commonly known as:  TOPROL-XL  Take 1 tablet (50 mg total) by mouth daily.  Start taking on:  03/19/2015     Rivaroxaban 15 & 20 MG Tbpk  Take as directed on package: Start with one 15mg  tablet by mouth twice a day with food. On Day 22, switch to one 20mg  tablet once a day with food. Start 04/18/2015.  Start taking on:  04/18/2015         The results of significant diagnostics from this hospitalization (including imaging, microbiology, ancillary and laboratory) are listed below for reference.    Significant Diagnostic Studies: Ct Angio Chest Pe W/cm &/or Wo Cm  03/16/2015  CLINICAL  DATA:  Shortness of breath since yesterday. History of pulmonary embolism and recent cessation of anti coagulation EXAM: CT ANGIOGRAPHY CHEST WITH CONTRAST TECHNIQUE: Multidetector CT imaging of the chest was performed using the standard protocol during bolus administration of intravenous contrast. Multiplanar CT image reconstructions and MIPs were obtained to evaluate the vascular anatomy. CONTRAST:  14mL OMNIPAQUE IOHEXOL 350 MG/ML SOLN COMPARISON:  07/31/2014 FINDINGS: THORACIC INLET/BODY WALL: Unchanged prominence of bilateral hilar lymph nodes. No acute finding. MEDIASTINUM: Cardiomegaly with left ventricular enlargement. Normalized RV to LV ratio at 0.7. No acute aortic findings. Pulmonary artery filling defects beginning in the distal right main pulmonary artery and branching into all lobar arteries and multiple segmental pulmonary arteries. Much of the clot is central and acute appearing. There is also likely residual chronic clot which is marginalized. LUNG WINDOWS: Numerous peripheral or peribronchovascular pulmonary nodules, stable from prior when allowing for motion on the previous study. The largest measure up to 7 mm, including in the right middle lobe on series 407 image 69. Nodules are marked with arrows for follow-up purposes. UPPER ABDOMEN: Partially visualized spleen appears enlarged with a 17 cm AP span and 6.6 cm thickness. OSSEOUS: No acute fracture.  No suspicious lytic or blastic lesions. Critical Value/emergent results  were called by telephone at the time of interpretation on 03/16/2015 at 10:10 am to Dr. Montine Circle , who verbally acknowledged these results. Review of the MIP images confirms the above findings. IMPRESSION: 1. 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 Resolved right ventricular enlargement/strain. 2. Stable axillary and mediastinal adenopathy with probable splenomegaly. If previously recommended lymphoproliferative workup was negative,  consider sarcoid. 3. Stable numerous sub cm pulmonary nodules, likely benign and related to #2. The largest measures up to 7 mm; recommend 1 year follow-up chest CT to prove benignity. Electronically Signed   By: Monte Fantasia M.D.   On: 03/16/2015 10:12   Dg Chest Port 1 View  03/16/2015  CLINICAL DATA:  Shortness of breath and chest pain. Recent pulmonary embolism. EXAM: PORTABLE CHEST 1 VIEW COMPARISON:  07/31/2014 FINDINGS: Borderline cardiomegaly remains stable. Low lung volumes noted. Mild bibasilar pulmonary opacity may be due to atelectasis or infiltrate no evidence of pleural effusion or pneumothorax. IMPRESSION: Mild bibasilar atelectasis versus infiltrates. Electronically Signed   By: Earle Gell M.D.   On: 03/16/2015 08:57     Microbiology: No results found for this or any previous visit (from the past 240 hour(s)).   Labs: Basic Metabolic Panel:  Recent Labs Lab 03/16/15 0820 03/17/15 1625 03/18/15 0330  NA 138  --  136  K 4.6  --  4.7  CL 105  --  100*  CO2 25  --  28  GLUCOSE 117*  --  108*  BUN 9  --  9  CREATININE 0.79  --  0.92  CALCIUM 9.1  --  9.2  MG  --  2.2  --    Liver Function Tests: No results for input(s): AST, ALT, ALKPHOS, BILITOT, PROT, ALBUMIN in the last 168 hours. No results for input(s): LIPASE, AMYLASE in the last 168 hours. No results for input(s): AMMONIA in the last 168 hours. CBC:  Recent Labs Lab 03/16/15 0820 03/18/15 0330  WBC 28.1* 28.1*  HGB 17.9* 17.2*  HCT 53.0* 52.0  MCV 86.7 88.4  PLT 121* 113*   Cardiac Enzymes: No results for input(s): CKTOTAL, CKMB, CKMBINDEX, TROPONINI in the last 168 hours. BNP: Invalid input(s): POCBNP CBG: No results for input(s): GLUCAP in the last 168 hours.  Time coordinating discharge:  Greater than 30 minutes  Signed:  Dani Danis, DO [facility] Pager: [phone] 03/18/2015, 12:31 PM

## 2015-03-17 NOTE — Consult Note (Signed)
Name: Ross [name] MRN: [id number] DOB: [birth year]    ADMISSION DATE:  03/16/2015 CONSULTATION DATE:  10/18  REFERRING MD :  tat  CHIEF COMPLAINT:  Pulmonary emboli  BRIEF PATIENT DESCRIPTION:  59 year old male w/ CLL (currently in remission) admitted 10/17 w/ recurrent PE. PCCM asked to eval.   SIGNIFICANT EVENTS    STUDIES:  10/18 echo: Left ventricle: The cavity size was normal. Systolic function wasmildly to moderately reduced. The estimated ejection fraction wasin the range of 40% to 45%. Diffuse hypokinesis. Dopplerparameters are consistent with abnormal left ventricularrelaxation (grade 1 diastolic dysfunction). Right ventricle: The cavity size was moderately dilated. Wall thickness was normal. Systolic function was moderately reduced. 10/17 ct chest:1. 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 Resolved right ventricular enlargement/strain. 2. Stable axillary and mediastinal adenopathy with probable splenomegaly. If previously recommended lymphoproliferative workup was negative, consider sarcoid. 3. Stable numerous sub cm pulmonary nodules, likely benign and related to #2. The largest measures up to 7 mm   HISTORY OF PRESENT ILLNESS:   59 year old male with a history of left lower extremity DVT and PE (March 2016), CLL, pulmonary nodules presented 10/17 with one-day history of lightheadedness and shortness of breath. The patient recently finished a 6 month course of rivaroxaban for his thromboembolism. He follows Dr. Leretha [name] at [facility] for his hematology needs. The patient denied any recent surgery, medication changes, trauma, or prolonged car rides or plane flights. On 03/15/2015, the patient initially felt some gas and bloating in his abdomen. On the morning of 03/16/2015, the patient began having some lightheadedness and shortness of breath. He was INITIALLY emergency department for further evaluation. CT of the chest at that time  revealed acute pulmonary embolus without any evidence of right ventricular strain. He was admitted to [facility]. Echo obtained raised question of decreased RV function so PCCM was asked to eval.   PAST MEDICAL HISTORY :   has a past medical history of Acute meniscal tear of knee (LEFT); PE (pulmonary embolism); CLL (chronic lymphocytic leukemia) ([place]) (03/16/2015); DVT of leg (deep venous thrombosis) ([place]); Habitual alcohol use; and PVC's (premature ventricular contractions).  has past surgical history that includes LEFT INGUINAL HERNIA REPAIR W/ MESH (06-10-2005); FATTY CYST REMOVED FROM FLANK AND BACK; Knee arthroscopy (07/22/2011); and Tonsillectomy. Prior to Admission medications   Medication Sig Start Date End Date Taking? Authorizing Provider  enoxaparin (LOVENOX) 150 MG/ML injection Inject 0.85 mLs (130 mg total) into the skin every 12 (twelve) hours. Patient not taking: Reported on 03/16/2015 08/04/14   Ross A Regalado, MD  rivaroxaban (XARELTO) 20 MG TABS tablet Take 1 tablet (20 mg total) by mouth daily with supper. Patient not taking: Reported on 03/16/2015 09/01/14   Ross Merle, MD   No Known Allergies  FAMILY HISTORY:  family history includes Cancer in his father. There is no history of CAD. SOCIAL HISTORY:  reports that he has never smoked. He has never used smokeless tobacco. He reports that he drinks alcohol. He reports that he does not use illicit drugs.  REVIEW OF SYSTEMS:   Constitutional: Negative for fever, chills, weight loss, malaise/fatigue and diaphoresis.  HENT: Negative for hearing loss, ear pain, nosebleeds, congestion, sore throat, neck pain, tinnitus and ear discharge.   Eyes: Negative for blurred vision, double vision, photophobia, pain, discharge and redness.  Respiratory: Negative for cough, hemoptysis, sputum production, shortness of breath imporved, wheezing and stridor.   Cardiovascular: Negative for chest pain, palpitations, orthopnea, claudication, leg swelling  and PND.  Gastrointestinal: Negative for heartburn, nausea, vomiting, abdominal discomfort improved, no diarrhea, constipation, blood in stool and melena.  Genitourinary: Negative for dysuria, urgency, frequency, hematuria and flank pain.  Musculoskeletal: Negative for myalgias, back pain, joint pain and falls.  Skin: Negative for itching and rash.  Neurological: Negative for dizziness, tingling, tremors, sensory change, speech change, focal weakness, seizures, loss of consciousness, weakness and headaches.  Endo/Heme/Allergies: Negative for environmental allergies and polydipsia. Does not bruise/bleed easily.  SUBJECTIVE: No events.  VITAL SIGNS: Temp:  [97.5 F (36.4 C)-98.6 F (37 C)] 98.6 F (37 C) (10/18 0846) Pulse Rate:  [72-79] 79 (10/18 0846) Resp:  [16-21] 21 (10/18 0846) BP: (116-146)/(67-91) 116/91 mmHg (10/18 0846) SpO2:  [93 %-100 %] 100 % (10/18 0846) Weight:  [129.2 kg (284 lb 13.4 oz)-129.321 kg (285 lb 1.6 oz)] 129.321 kg (285 lb 1.6 oz) (10/18 0437)  PHYSICAL EXAMINATION: General:  Awake, alert, no focal def  Neuro:  No focal def  HEENT:  NCAT, no JVD  Cardiovascular:  Rrr, no MRG Lungs:  Clear and w/out accessory muscle use  Abdomen:  Soft, non-tender, no OM  Musculoskeletal:  Intact, no focal weakness Skin:  LE edema, brisk CR. 2 + pulses.    Recent Labs Lab 03/16/15 0820  NA 138  K 4.6  CL 105  CO2 25  BUN 9  CREATININE 0.79  GLUCOSE 117*    Recent Labs Lab 03/16/15 0820  HGB 17.9*  HCT 53.0*  WBC 28.1*  PLT 121*   Ct Angio Chest Pe W/cm &/or Wo Cm  03/16/2015  CLINICAL DATA:  Shortness of breath since yesterday. History of pulmonary embolism and recent cessation of anti coagulation EXAM: CT ANGIOGRAPHY CHEST WITH CONTRAST TECHNIQUE: Multidetector CT imaging of the chest was performed using the standard protocol during bolus administration of intravenous contrast. Multiplanar CT image reconstructions and MIPs were obtained to evaluate the  vascular anatomy. CONTRAST:  93m OMNIPAQUE IOHEXOL 350 MG/ML SOLN COMPARISON:  07/31/2014 FINDINGS: THORACIC INLET/BODY WALL: Unchanged prominence of bilateral hilar lymph nodes. No acute finding. MEDIASTINUM: Cardiomegaly with left ventricular enlargement. Normalized RV to LV ratio at 0.7. No acute aortic findings. Pulmonary artery filling defects beginning in the distal right main pulmonary artery and branching into all lobar arteries and multiple segmental pulmonary arteries. Much of the clot is central and acute appearing. There is also likely residual chronic clot which is marginalized. LUNG WINDOWS: Numerous peripheral or peribronchovascular pulmonary nodules, stable from prior when allowing for motion on the previous study. The largest measure up to 7 mm, including in the right middle lobe on series 407 image 69. Nodules are marked with arrows for follow-up purposes. UPPER ABDOMEN: Partially visualized spleen appears enlarged with a 17 cm AP span and 6.6 cm thickness. OSSEOUS: No acute fracture.  No suspicious lytic or blastic lesions. Critical Value/emergent results were called by telephone at the time of interpretation on 03/16/2015 at 10:10 am to Dr. RMontine Circle, who verbally acknowledged these results. Review of the MIP images confirms the above findings. IMPRESSION: 1. 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 Resolved right ventricular enlargement/strain. 2. Stable axillary and mediastinal adenopathy with probable splenomegaly. If previously recommended lymphoproliferative workup was negative, consider sarcoid. 3. Stable numerous sub cm pulmonary nodules, likely benign and related to #2. The largest measures up to 7 mm; recommend 1 year follow-up chest CT to prove benignity. Electronically Signed   By: JMonte FantasiaM.D.   On:  03/16/2015 10:12   Dg Chest Port 1 View  03/16/2015  CLINICAL DATA:  Shortness of breath and chest pain. Recent pulmonary embolism.  EXAM: PORTABLE CHEST 1 VIEW COMPARISON:  07/31/2014 FINDINGS: Borderline cardiomegaly remains stable. Low lung volumes noted. Mild bibasilar pulmonary opacity may be due to atelectasis or infiltrate no evidence of pleural effusion or pneumothorax. IMPRESSION: Mild bibasilar atelectasis versus infiltrates. Electronically Signed   By: Earle Gell M.D.   On: 03/16/2015 08:57    ASSESSMENT / PLAN:  Attending Note:  59 year old male with a previous PE who was on NOAC that stopped 1 month ago. Returns to the hospital with the chief complaint of bloating and SOB. CT revealed an acute on chronic pulmonary embolism and resolved right ventricular enlargement as well as pulmonary nodules and stable axillary and mediastinal LAN with splenomegally that are stable. Echo done that showed moderate dilation of RV with normal EF. PCCM was consulted for PE, RV dilation, pulmonary nodules and mediastinal LAN. I reviewed CT myself, PE acute on chronic and stable pulmonary nodules as noted. Lungs clear on exam. Discussed with TRH-MD and PCCM-MD. U/S doppler of the lower ext is pending.   PE: previous PE.   - Anti-coagulation for life.   - Titrate O2 for sat of 92-95% given cardiac disease.   - Lower ext doppler to insure no new DVT that may require IVC filter.  Hypoxemia:   - Ambulatory desaturation study.   - Titrate O2 for sat of 88-92%.   RV dilation:   - Anticoagulation as ordered.   - F/U imaging.   Pulmonary nodules: reviewed CT from now and previous. Nodules appear very much the same.   - F/U in 1 year.   - Will send sarcoid and auto-immune work up.   LAN: CLL by history in remission..   - Check ESR   - Check CRP.   - Check ACE level.   Patient seen and examined, agree with above note. I dictated the care and orders written for this patient under my direction.   Rush Farmer, MD  [phone]  03/17/2015, 4:01 PM

## 2015-03-18 DIAGNOSIS — I429 Cardiomyopathy, unspecified: Secondary | ICD-10-CM

## 2015-03-18 DIAGNOSIS — R739 Hyperglycemia, unspecified: Secondary | ICD-10-CM

## 2015-03-18 LAB — CBC
HEMATOCRIT: 52 % (ref 39.0–52.0)
Hemoglobin: 17.2 g/dL — ABNORMAL HIGH (ref 13.0–17.0)
MCH: 29.3 pg (ref 26.0–34.0)
MCHC: 33.1 g/dL (ref 30.0–36.0)
MCV: 88.4 fL (ref 78.0–100.0)
Platelets: 113 10*3/uL — ABNORMAL LOW (ref 150–400)
RBC: 5.88 MIL/uL — AB (ref 4.22–5.81)
RDW: 17.5 % — ABNORMAL HIGH (ref 11.5–15.5)
WBC: 28.1 10*3/uL — AB (ref 4.0–10.5)

## 2015-03-18 LAB — LIPID PANEL
Cholesterol: 157 mg/dL (ref 0–200)
HDL: 33 mg/dL — ABNORMAL LOW (ref >40)
LDL Cholesterol: 103 mg/dL — ABNORMAL HIGH (ref 0–99)
Total CHOL/HDL Ratio: 4.8 RATIO
Triglycerides: 104 mg/dL (ref <150)
VLDL: 21 mg/dL (ref 0–40)

## 2015-03-18 LAB — BASIC METABOLIC PANEL
Anion gap: 8 (ref 5–15)
BUN: 9 mg/dL (ref 6–20)
CALCIUM: 9.2 mg/dL (ref 8.9–10.3)
CO2: 28 mmol/L (ref 22–32)
Chloride: 100 mmol/L — ABNORMAL LOW (ref 101–111)
Creatinine, Ser: 0.92 mg/dL (ref 0.61–1.24)
GFR calc Af Amer: >60 mL/min (ref >60)
Glucose, Bld: 108 mg/dL — ABNORMAL HIGH (ref 65–99)
Potassium: 4.7 mmol/L (ref 3.5–5.1)
Sodium: 136 mmol/L (ref 135–145)

## 2015-03-18 LAB — HEMOGLOBIN A1C
HEMOGLOBIN A1C: 5.8 % — AB (ref 4.8–5.6)
Mean Plasma Glucose: 120 mg/dL

## 2015-03-18 LAB — HEPARIN LEVEL (UNFRACTIONATED): HEPARIN UNFRACTIONATED: 0.47 [IU]/mL (ref 0.30–0.70)

## 2015-03-18 MED ORDER — ENOXAPARIN SODIUM 150 MG/ML ~~LOC~~ SOLN
1.0000 mg/kg | Freq: Two times a day (BID) | SUBCUTANEOUS | Status: DC
  Administered 2015-03-18: 130 mg via SUBCUTANEOUS
  Filled 2015-03-18 (×2): qty 1

## 2015-03-18 MED ORDER — METOPROLOL SUCCINATE ER 25 MG PO TB24: 25.0000 mg | ORAL_TABLET | Freq: Every day | ORAL | Status: DC

## 2015-03-18 MED ORDER — METOPROLOL SUCCINATE ER 50 MG PO TB24: 50.0000 mg | ORAL_TABLET | Freq: Every day | ORAL | Status: DC

## 2015-03-18 MED ORDER — RIVAROXABAN (XARELTO) VTE STARTER PACK (15 & 20 MG): ORAL_TABLET | ORAL | Status: DC

## 2015-03-18 MED ORDER — ENOXAPARIN SODIUM 150 MG/ML ~~LOC~~ SOLN: 1.0000 mg/kg | Freq: Two times a day (BID) | SUBCUTANEOUS | Status: DC

## 2015-03-18 MED ORDER — RIVAROXABAN (XARELTO) VTE STARTER PACK (15 & 20 MG)
ORAL_TABLET | ORAL | Status: DC
Start: 2015-04-18 — End: 2015-05-11

## 2015-03-18 NOTE — Progress Notes (Signed)
Patient Profile: [name] is a 59 y/o gentleman with history of PE/DVT in 07/2014 which ultimately led to a diagnosis of CLL/SLL, PVCs, and habitual alcohol use (2 drinks+ daily) who presented to [facility] 03/16/2015 with dyspnea and recurrent PE. Cardiac workup this admission included a repeat 2D echo 03/17/15 showing EF 40-45% with diffuse HK. Etiology of LV dysfunction unclear. Cards consulted to evaluate.    Subjective: No complaints. Denies CP and dyspnea. Ready to go home.   Objective: Vital signs in last 24 hours: Temp:  [97.4 F (36.3 C)-98 F (36.7 C)] 98 F (36.7 C) (10/19 0512) Pulse Rate:  [75-84] 78 (10/19 0512) Resp:  [20] 20 (10/19 0512) BP: (114-147)/(81-88) 124/87 mmHg (10/19 0512) SpO2:  [94 %-96 %] 95 % (10/19 0512) Weight:  [285 lb 7 oz (129.475 kg)] 285 lb 7 oz (129.475 kg) (10/19 0512) Last BM Date: 03/15/15  Intake/Output from previous day: 10/18 0701 - 10/19 0700 In: 1123.8 [P.O.:975; I.V.:148.8] Out: 550 [Urine:550] Intake/Output this shift: Total I/O In: 240 [P.O.:240] Out: -   Medications Current Facility-Administered Medications  Medication Dose Route Frequency Provider Last Rate Last Dose  . enoxaparin (LOVENOX) injection 130 mg  1 mg/kg Subcutaneous Q12H David Tat, MD      . heparin ADULT infusion 100 units/mL (25000 units/250 mL)  1,900 Units/hr Intravenous Continuous Donalynn Furlong [place], RPH 19 mL/hr at 03/17/15 2210 1,900 Units/hr at 03/17/15 2210  . metoprolol succinate (TOPROL-XL) 24 hr tablet 25 mg  25 mg Oral Daily Dayna N Dunn, PA-C   25 mg at 03/18/15 0940  . ondansetron (ZOFRAN) tablet 4 mg  4 mg Oral Q6H PRN Willia Craze, NP       Or  . ondansetron [facility]) injection 4 mg  4 mg Intravenous Q6H PRN Willia Craze, NP      . sodium chloride 0.9 % injection 3 mL  3 mL Intravenous Q12H Willia Craze, NP   3 mL at 03/18/15 0941    PE: General appearance: alert, cooperative, no distress and moderately  obese Neck: no carotid bruit and no JVD Lungs: decreased BS at the bases, slight wheezing in the RLL Heart: regular rate and rhythm, S1, S2 normal, no murmur, click, rub or gallop Extremities: no LEE Pulses: 2+ and symmetric Skin: warm and dry Neurologic: Grossly normal  Lab Results:   Recent Labs  03/16/15 0820 03/18/15 0330  WBC 28.1* 28.1*  HGB 17.9* 17.2*  HCT 53.0* 52.0  PLT 121* 113*   BMET  Recent Labs  03/16/15 0820 03/18/15 0330  NA 138 136  K 4.6 4.7  CL 105 100*  CO2 25 28  GLUCOSE 117* 108*  BUN 9 9  CREATININE 0.79 0.92  CALCIUM 9.1 9.2   PT/INR No results for input(s): LABPROT, INR in the last 72 hours. Cholesterol  Recent Labs  03/18/15 0330  CHOL 157    Assessment/Plan  Principal Problem:   Pulmonary embolism (HCC) Active Problems:   Thrombocytopenia (HCC)   Mediastinal adenopathy   History of DVT of lower extremity   CLL (chronic lymphocytic leukemia) (HCC)   Polycythemia, secondary   Cardiomyopathy ([place])   Frequent PVCs   AIVR (accelerated idioventricular rhythm)   Alcohol ingestion of more than 4 drinks/week   Hyperglycemia   1. Newly Recognized Cardiomyopathy: EF 40-45%, continued RV dysfunction - etiology of LV dysfunction not totally clear. Question related to frequent PVCs versus EtOH. We will arrange for an OP stress test to  assess for coronary ischemia.  Volume is stable. No dyspnea.   2. Frequent PVC: TSH and electrolytes are normal. Can  Consider 24 hr holter to further evaluate his PVC burden.  I'll see him in the office for follow up   3. PE: management per IM.  Still on heparin drip . Plan is for Lovenox for a month and then Xarelto  Indefinitely   4. Habitual Alcohol Use: may be possible etiology of his cardiomyopathy. Encourage reducing consumption.   5. Hyperglycemia: Pre-diabetic with Hgb A1c of 5.8   Ready to go home    LOS: 2 days    Brittainy M. Rosita Fire, PA-C 03/18/2015 11:31 AM

## 2015-03-18 NOTE — Progress Notes (Addendum)
[place] for Heparin --> Lovenox Indication: PE  No Known Allergies  Patient Measurements: Height: 6\' 2"  (188 cm) Weight: 285 lb 7 oz (129.475 kg) (scale c) IBW/kg (Calculated) : 82.2 Heparin Dosing Weight: 110 kg  Vital Signs: Temp: 98 F (36.7 C) (10/19 0512) Temp Source: Oral (10/19 0512) BP: 124/87 mmHg (10/19 0512) Pulse Rate: 78 (10/19 0512)  Labs:  Recent Labs  03/16/15 0820 03/18/15 0330  HGB 17.9* 17.2*  HCT 53.0* 52.0  PLT 121* 113*  HEPARINUNFRC  --  0.47  CREATININE 0.79 0.92    Estimated Creatinine Clearance: 125.2 mL/min (by C-G formula based on Cr of 0.92).  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 He has CLL which is in remission and was treated for DVT and PE in March 2016 (Lovenox for 1 month, Xarelto for 5 months). Plan is for Lovenox for 1 month then Xarelto indefinitely. No bleeding noted, Hb stable, platelets are low but likely contribution from CLL.  Goal of Therapy:  Anti-Xa level 0.6-1 units/ml 4hrs after LMWH dose given Monitor platelets by anticoagulation protocol: Yes   Plan:  Lovenox 130 mg SQ q12h - 1st dose at 16:00 per patient request Discontinue heparin drip around 14:45 CBC q72h while on Lovenox No heparin level at 12:00 since will be off shortly after heparin level was due Monitor for s/sx of bleeding   [facility], Pole Ojea.D., BCPS Clinical Pharmacist Pager: [phone] 03/18/2015 9:28 AM

## 2015-03-18 NOTE — Progress Notes (Signed)
Discharge instructions given. Pt verbalized understanding and all questions were answered.  

## 2015-03-18 NOTE — Progress Notes (Signed)
[place] for Heparin Indication: PE  No Known Allergies  Patient Measurements: Height: 6\' 2"  (188 cm) Weight: 285 lb 1.6 oz (129.321 kg) IBW/kg (Calculated) : 82.2 Heparin Dosing Weight: 110 kg  Vital Signs: Temp: 97.4 F (36.3 C) (10/19 0152) Temp Source: Oral (10/19 0152) BP: 114/84 mmHg (10/19 0152) Pulse Rate: 78 (10/19 0152)  Labs:  Recent Labs  03/16/15 0820 03/18/15 0330  HGB 17.9* 17.2*  HCT 53.0* 52.0  PLT 121* PENDING  HEPARINUNFRC  --  0.47  CREATININE 0.79  --     Estimated Creatinine Clearance: 143.8 mL/min (by C-G formula based on Cr of 0.79).  Assessment: 59 yo Male with PE for heparin   Goal of Therapy:  Heparin level 0.3-0.7 units/ml Monitor platelets by anticoagulation protocol: Yes   Plan:  No change to heparin for now Recheck level at noon to verify   Icel Castles, Bronson Curb 03/18/2015,4:18 AM

## 2015-03-18 NOTE — Discharge Instructions (Signed)
Information on my medicine - XARELTO (rivaroxaban)  This medication education was reviewed with me or my healthcare representative as part of my discharge preparation.  The pharmacist that spoke with me during my hospital stay was:  [facility], Margot Chimes, [place]? Xarelto was prescribed to treat blood clots that may have been found in the veins of your legs (deep vein thrombosis) or in your lungs (pulmonary embolism) and to reduce the risk of them occurring again.  What do you need to know about Xarelto? Your dose is one 20 mg tablet taken ONCE A DAY with your evening meal. You will begin this after you complete treatment with Lovenox.  DO NOT stop taking Xarelto without talking to the health care provider who prescribed the medication.  Refill your prescription for 20 mg tablets before you run out.  After discharge, you should have regular check-up appointments with your healthcare provider that is prescribing your Xarelto.  In the future your dose may need to be changed if your kidney function changes by a significant amount.  What do you do if you miss a dose? If you are taking Xarelto TWICE DAILY and you miss a dose, take it as soon as you remember. You may take two 15 mg tablets (total 30 mg) at the same time then resume your regularly scheduled 15 mg twice daily the next day.  If you are taking Xarelto ONCE DAILY and you miss a dose, take it as soon as you remember on the same day then continue your regularly scheduled once daily regimen the next day. Do not take two doses of Xarelto at the same time.   Important Safety Information Xarelto is a blood thinner medicine that can cause bleeding. You should call your healthcare provider right away if you experience any of the following: ? Bleeding from an injury or your nose that does not stop. ? Unusual colored urine (red or dark brown) or unusual colored stools (red or black). ? Unusual bruising  for unknown reasons. ? A serious fall or if you hit your head (even if there is no bleeding).  Some medicines may interact with Xarelto and might increase your risk of bleeding while on Xarelto. To help avoid this, consult your healthcare provider or pharmacist prior to using any new prescription or non-prescription medications, including herbals, vitamins, non-steroidal anti-inflammatory drugs (NSAIDs) and supplements.  This website has more information on Xarelto: [email or web address]

## 2015-03-19 LAB — ANGIOTENSIN CONVERTING ENZYME: Angiotensin-Converting Enzyme: 26 U/L (ref 14–82)

## 2015-03-25 ENCOUNTER — Ambulatory Visit (INDEPENDENT_AMBULATORY_CARE_PROVIDER_SITE_OTHER): Payer: Commercial Managed Care - PPO | Admitting: Internal Medicine

## 2015-03-25 ENCOUNTER — Encounter: Payer: Self-pay | Admitting: Internal Medicine

## 2015-03-25 VITALS — BP 118/80 | HR 69 | Ht 74.0 in | Wt 292.8 lb

## 2015-03-25 DIAGNOSIS — I2699 Other pulmonary embolism without acute cor pulmonale: Secondary | ICD-10-CM

## 2015-03-25 DIAGNOSIS — R599 Enlarged lymph nodes, unspecified: Secondary | ICD-10-CM

## 2015-03-25 DIAGNOSIS — R590 Localized enlarged lymph nodes: Secondary | ICD-10-CM

## 2015-03-25 NOTE — Progress Notes (Signed)
Subjective:     Patient ID: Ross [name], male   DOB: 1955-10-10,    MRN: [id number]  HPI  75 yowm never smoker frequent trips to [facility] with chronic issues with L leg  dating back x 1995 last scope around 2011 admitted with PE to [place] date: 07/31/2014 Discharge date: 08/04/2014  Time spent: 35 minutes  Recommendations for Outpatient Follow-up:  1. Follow up with PCP , need CBC to monitor count now that patient is on Lovenox.  2. Needs follow up with Dr Burr Medico for further work up of Mediastinal, abdominal lymphadenopathy.  3.   Discharge Diagnoses:   Sub-Massive PE (pulmonary embolism)  DVT, lower extremity  Mediastinal, abdominal, pelvis lymphadenopathy.   Lung nodule.   Thrombocytopenia  Leukocytosis  Bilateral pulmonary embolism   Discharge Condition: stable.   Diet recommendation: Heart Healthy  Filed Weights   07/31/14 1129 07/31/14 1724  Weight: 127.007 kg (280 lb) 127.007 kg (280 lb)    History of present illness:  59 year old male with no significant past medical history who presented to the emergency department with increasing dyspnea on exertion without chest pain or other features. Evaluation revealed bilateral pulmonary embolus as well as left lower extremity DVT. He was started on heparin and referred for admission.  Patient travels a lot for work including car and plane travel. He has never had any history of blood clots that he is aware of nor family history of blood clots. He said some chronic left lower extremity pain from long-standing musculoskeletal issues as well as chronic left lower extremity edema and has had a history of surgery on the left knee. He has had some increased tightness in the left calf lately however. He has had no chest pain. He has however had dyspnea on exertion becomes short of breath with minimal activity after a few minutes, this immediately improves with rest.  In the emergency department  afebrile, VSS, minimal hypoxia, SpO2 96% on 2L. BMP with K+ 5.2, troponin negative; ddimer 7.27, WBC 20.5, WBC 12.2. Preliminary lower extremities venous Doppler study positive for DVT. Chest x-ray no acute disease. CT angiogram of the chest positive for acute PE with evidence of right heart strain consistent with at least submassive PE. Multiple borderline enlarged and mildly enlarged mediastinal or hilar lymph nodes as well as bilateral axillary lymph nodes. Consider lymphoproliferative disorder.  Hospital Course:  -Submassive PE; Left femoral and left Popliteal vein Thrombosis  ECHO with The cavity size was moderately dilated. Systolic function was mildly to moderately reduced.Right atrium: The atrium was moderately dilated. Pulmonary recommend heparin GTT for 3 days  Plan to discharge on Lovenox for 1 month, to subsequently transition to Coumadin vs Xarelto. Need at least 6 month treatment. Needs to follow up with Dr Burr Medico.  CCM consulted, no need for Thrombolysis at this time. Continue with heparin Gtt over the weekend.  I have ordered Factor V nl/ Antithrombin III low 65.  Transition to Lovenox on 3-6. Medical stable. Plan to discharge home today.   2-Mediastinal Lymphadenopathy, leukocytosis; Hematology consulted.  Plan for work up outpatient.  CT abdomen and pelvis: There are numerous mesenteric and retroperitoneal lymph nodes in the abdomen and pelvis. Many were present previously but all are enlarged when compared to the prior study, and there has been an increase in number as well. Lymphoproliferative disorder suspected and further investigation is warranted. Discussed Ct abdomen with Dr Burr Medico, plan to follow up with her in  one month to arrange biopsy.   3-Leukocytosis, productive cough. Levaquin discontinue as recommended by pulmonologist.  WBC fluctuate.   Procedures:  Doppler; Findings consistent with deep vein thrombosis involving the left femoral vein and left  popliteal vein.  ECHO; The cavity size was moderately dilated       09/01/2014 1st [facility] office visit/ Travian Kerner   Chief Complaint  Patient presents with  . HFU    Pt states that he is doing well since hospital d/c. His breathing is back at normal baseline and denies any new co's today.   Not limited by breathing from desired activities   rec The minimum for blood thinner is 6 months then reassess  Risk / benefit (assuming no lung symptoms and  normal venous dopplers and echocardiogram) [facility] [employer] is great place for elastic hose in [place] at the [address] and [address]  There is no indication to biopsy your lung lymph nodes at this point  Resume normal activity and let us know if the breathing capacity doesn't normalize over time> says he did resume 100% prev activity    Stopped xarelto mid sept 2016 and started having sob mid oct 2016 > readmit   Admit date: 03/16/2015 Discharge date: 03/18/15 Recommendations for Outpatient Follow-up:  4. Pt will need to follow up with PCP in 2 weeks post discharge   Discharge Diagnoses:  Acute pulmonary embolus -The patient remained hemodynamically stable without any hypoxemia -Venous duplex of the left lower extremity revealed indeterminate age clot in the mid to distal femoral vein and popliteal vein. There was no acute DVT -The patient was seen by hematology who recommended a one-month of Lovenox and transitioning to rivaroxaban thereafter indefinitely -Echo---Echo 40-45%, grade 1 DD, dilated RV with mod reduced function, IVC dilated -appreciate pulmonary consult -patient was observed for 48 hours without any incidence. -Given the patient's recurrence, he will need indefinite anticoagulation -per hematology recommendations--home with lovenox 130mg /kg q 12 hours x 30 days, then switch to rivaroxaban indefinitely -On room air, the patient maintain oxygen saturation 95-96 percent. 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 Leretha Pol -No signs of active bleeding Pulmonary nodules -CT and grandmother chest revealed that his pulmonary nodule size has remained stable with largest nodule in the right middle lobe measuring 7 mm -He has stable mediastinal lymphadenopathy -He will need continued outpatient surveillance CLL/SLL -followed at [facility] as discussed above     03/25/2015  f/u ov/Jiyaan Steinhauser re: recurrent dvt/pe Never had the studies Rec at last ov but [facility] heme/onc approved trial off NOAC     Able to walk several miles a day at baseline s stopping doing hills and stairs ok also > back to near nl now       No obvious day to day or daytime variabilty or assoc chronic cough or cp or chest tightness, subjective wheeze overt sinus or hb symptoms. No unusual exp hx or h/o childhood pna/ asthma or knowledge of premature birth.  Sleeping ok without nocturnal  or early am exacerbation  of respiratory  c/o's or need for noct saba. Also denies any obvious fluctuation of symptoms with weather or environmental changes or other aggravating or alleviating factors except as outlined above   Current Medications, Allergies, Complete Past Medical History, Past Surgical History, Family History, and Social History were reviewed in [employer] record.  ROS  The following are not active complaints unless  bolded sore throat, dysphagia, dental problems, itching, sneezing,  nasal congestion or excess/ purulent secretions, ear ache,   fever, chills, sweats, unintended wt loss, pleuritic or exertional cp, hemoptysis,  orthopnea pnd or leg swelling, presyncope, palpitations, heartburn, abdominal pain, anorexia, nausea, vomiting, diarrhea  or change in bowel or urinary habits, change in stools or urine, dysuria,hematuria,  rash, arthralgias, visual complaints, headache, numbness weakness or ataxia or problems with walking or coordination,  change in mood/affect or memory.              Objective:   Physical Exam    amb wm nad   Wt Readings from Last 3 Encounters:  03/25/15 292 lb 12.8 oz (132.813 kg)  03/18/15 285 lb 7 oz (129.475 kg)  09/01/14 289 lb 14.4 oz (131.498 kg)    Vital signs reviewed    HEENT: nl dentition, turbinates, and orophanx. Nl external ear canals without cough reflex   NECK :  without JVD/Nodes/TM/ nl carotid upstrokes bilaterally   LUNGS: no acc muscle use, clear to A and P bilaterally without cough on insp or exp maneuvers   CV:  RRR  no s3 or murmur or increase in P2, no edema   ABD:  soft and nontender with nl excursion in the supine position. No bruits or organomegaly, bowel sounds nl  MS:  warm without deformities, calf tenderness, cyanosis or clubbing  SKIN: warm and dry without lesions    NEURO:  alert, approp, no deficits        I personally reviewed images and agree with radiology impression as follows:  CTa chest 03/16/15 1. 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 Resolved right ventricular enlargement/strain. 2. Stable axillary and mediastinal adenopathy with probable splenomegaly. If previously recommended lymphoproliferative workup was negative, consider sarcoid. 3. Stable numerous sub cm pulmonary nodules, likely benign and related to #2. The largest measures up to 7 mm; recommend 1 year follow-up chest  CT to prove benignity.      Assessment:

## 2015-03-25 NOTE — Patient Instructions (Signed)
Weight control is simply a matter of calorie balance which needs to be tilted in your favor by eating less and exercising more.  To get the most out of exercise, you need to be continuously aware that you are short of breath, but never out of breath, for 30 minutes daily. As you improve, it will actually be easier for you to do the same amount of exercise  in  30 minutes so always push to the level where you are short of breath.  If this does not result in gradual weight reduction then I strongly recommend you see a nutritionist with a food diary x 2 weeks so that we can work out a negative calorie balance which is universally effective in steady weight loss programs.  Think of your calorie balance like you do your bank account where in this case you want the balance to go down so you must take in less calories than you burn up.  It's just that simple:  Hard to do, but easy to understand.  Good luck!   Agree you will need some form of anticoagulation for life - pulmonary follow up is as needed

## 2015-03-26 NOTE — Assessment & Plan Note (Addendum)
Dx 07/31/14 CTa Venous dopplers 07/31/14 deep vein thrombosis involving the left femoral vein and left popliteal vein. - Repeat venous dopplers done 03/17/15 > extensive clot still present L leg  Echo 08/01/14 Left ventricle: The cavity size was normal. Wall thickness was increased in a pattern of moderate LVH. Systolic function was normal. The estimated ejection fraction was in the range of 50% to 55%. Wall motion was normal; there were no regional wall motion abnormalities. Due to tachycardia, there was fusion of early and atrial contributions to ventricular filling. - Left atrium: The atrium was moderately dilated. - Right ventricle: The cavity size was moderately dilated. Systolic function was mildly to moderately reduced. - Right atrium: The atrium was moderately dilated Repeat CTa off xarelto x one month 03/16/15 > Pos PE, neg RV strain  Repeat ECHO 03/17/15 > The RV cavity size was moderately dilated. Wall thickness was normal. Systolic function was moderately reduced. Lifelong anticoagulation rec at f/u pulmonary ov 03/25/2015   The reasoning for the previous recommendation to only stop the anticoagulation if he had no symptoms, normal echocardiogram and venous Dopplers, is because we don't really know what where he stood at the end of 6 months but the point is moot now. Most likely the morbid obesity was an additional risk factor but had not been addressed prior to stopping his anticoagulation. Surprisingly is again bounced back very quickly and feels fine except for persistent L leg  symptoms that date back to his original DVT pulmonary embolus.  I had an extended discussion with the patient reviewing all relevant studies completed to date and  lasting 25 minutes of a 40  minute visit    Each maintenance medication was reviewed in detail including most importantly the difference between maintenance and prns and under what circumstances the prns are to be triggered using an  action plan format that is not reflected in the computer generated alphabetically organized AVS.    Please see instructions for details which were reviewed in writing and the patient given a copy highlighting the part that I personally wrote and discussed at today's ov.

## 2015-03-26 NOTE — Assessment & Plan Note (Signed)
CT chest 07/31/14 Multiple borderline enlarged and mildly enlarged mediastinal and hilar lymph nodes, as well as bilateral axillary lymph nodes Dx CLL/ f/u [facility]   This fully explains the CT chest findings and no pulmonary follow-up is planned at this point with all follow-up at [facility]

## 2015-03-26 NOTE — Assessment & Plan Note (Signed)
Complicated by DVT/ PE   Body mass index is 37.58   Lab Results  Component Value Date   TSH 4.114 03/17/2015     I reviewed the need and the process to achieve and maintain neg calorie balance > defer f/u primary care including intermittently monitoring thyroid status

## 2015-04-19 NOTE — Progress Notes (Signed)
Cardiology Office Note   Date:  04/19/2015   ID:  Ross [name], DOB May 29, 1956, MRN [id number]  PCP:  Ross Battiest, MD  Cardiologist:   Ross Headings, MD   Chief Complaint  Patient presents with  . Follow-up   Problem List 1. Recurrent Pulmonary Emboli 2. Chronic systolic CHF 3. PVCs     Nov. 20, 2016:  History of Present Illness: Ross [name] is a 59 y.o. male who presents for follow up of his recent hospitalization for recurrent pulmonary emboli .   Was found to have mildly decreased LV systolic function .   Was discharged with Lovenox for 1 month.  Now is on Xarelto 15 mg BID and is scheduled to change to Xarelto 20 mg a day following 21 days of the 15 BID dosing .  Marland Kitchen  This recommendation was given by hematology .   Has seen by Dr. Melvyn [name].    Has done well since his DC.  No CP .  Breathing was ok      Past Medical History  Diagnosis Date  . Acute meniscal tear of knee LEFT  . PE (pulmonary embolism)     a. Multiple bilateral PEs on CT 07/2014. Initially tx with Lovenox/Xarelto x 6 months, but readmitted 02/2015 with recurrent acute on chronic PE.  Marland Kitchen CLL (chronic lymphocytic leukemia) ([place]) 03/16/2015    a. CLL/SLL followed at [facility].  . DVT of leg (deep venous thrombosis) ([place])   . Habitual alcohol use   . PVC's (premature ventricular contractions)     a. Frequent, noted on telemetry during 02/2015 admisson - also seen on EKG in 07/2014.  . Cardiomyopathy ([place])     a. Dx 02/2015: EF 40-45% with continued RV dysfunction as well.  . PE (pulmonary embolism) 03/17/2015    Past Surgical History  Procedure Laterality Date  . Left inguinal hernia repair w/ mesh  06-10-2005  . Fatty cyst removed from flank and back      FLANK- 15 YRS AGO/  BACK 12 YRS AGO  . Knee arthroscopy  07/22/2011    Procedure: ARTHROSCOPY KNEE;  Surgeon: Ross Sinning, MD;  Location: [facility];  Service: Orthopedics;  Laterality: Left;  Partial medial menisectomy  and shaving of the patella  . Tonsillectomy       Current Outpatient Prescriptions  Medication Sig Dispense Refill  . enoxaparin (LOVENOX) 150 MG/ML injection Inject 0.85 mLs (130 mg total) into the skin every 12 (twelve) hours. 60 Syringe 1  . Investigational - Study Medication Additional Study Details: Placebo vs Fish oil with Vit D- 2 capsules daily    . metoprolol succinate (TOPROL-XL) 50 MG 24 hr tablet Take 1 tablet (50 mg total) by mouth daily. 30 tablet 1  . Rivaroxaban 15 & 20 MG TBPK Take as directed on package: Start with one 15mg  tablet by mouth twice a day with food. On Day 22, switch to one 20mg  tablet once a day with food. Start 04/18/2015. (Patient not taking: Reported on 03/25/2015) 51 each 0   No current facility-administered medications for this visit.    Allergies:   Review of patient's allergies indicates no known allergies.    Social History:  The patient  reports that he has never smoked. He has never used smokeless tobacco. He reports that he drinks alcohol. He reports that he does not use illicit drugs.   Family History:  The patient's family history includes Cancer in his father. There is no history  of CAD.    ROS:  Please see the history of present illness.    Review of Systems: Constitutional:  denies fever, chills, diaphoresis, appetite change and fatigue.  HEENT: denies photophobia, eye pain, redness, hearing loss, ear pain, congestion, sore throat, rhinorrhea, sneezing, neck pain, neck stiffness and tinnitus.  Respiratory: denies SOB, DOE, cough, chest tightness, and wheezing.  Cardiovascular: denies chest pain, palpitations and leg swelling.  Gastrointestinal: denies nausea, vomiting, abdominal pain, diarrhea, constipation, blood in stool.  Genitourinary: denies dysuria, urgency, frequency, hematuria, flank pain and difficulty urinating.  Musculoskeletal: denies  myalgias, back pain, joint swelling, arthralgias and gait problem.   Skin: denies pallor,  rash and wound.  Neurological: denies dizziness, seizures, syncope, weakness, light-headedness, numbness and headaches.   Hematological: denies adenopathy, easy bruising, personal or family bleeding history.  Psychiatric/ Behavioral: denies suicidal ideation, mood changes, confusion, nervousness, sleep disturbance and agitation.       All other systems are reviewed and negative.    PHYSICAL EXAM: VS:  There were no vitals taken for this visit. , BMI There is no weight on file to calculate BMI. GEN: Well nourished, well developed, in no acute distress HEENT: normal Neck: no JVD, carotid bruits, or masses Cardiac: RRR; no murmurs, rubs, or gallops,no edema  Respiratory:  clear to auscultation bilaterally, normal work of breathing GI: soft, nontender, nondistended, + BS MS: no deformity or atrophy Skin: warm and dry, no rash Neuro:  Strength and sensation are intact Psych: normal   EKG:  EKG is not ordered today.    Recent Labs: 09/01/2014: ALT 19 03/17/2015: Magnesium 2.2; TSH 4.114 03/18/2015: BUN 9; Creatinine, Ser 0.92; Hemoglobin 17.2*; Platelets 113*; Potassium 4.7; Sodium 136    Lipid Panel    Component Value Date/Time   CHOL 157 03/18/2015 0330   TRIG 104 03/18/2015 0330   HDL 33* 03/18/2015 0330   CHOLHDL 4.8 03/18/2015 0330   VLDL 21 03/18/2015 0330   LDLCALC 103* 03/18/2015 0330      Wt Readings from Last 3 Encounters:  03/25/15 292 lb 12.8 oz (132.813 kg)  03/18/15 285 lb 7 oz (129.475 kg)  09/01/14 289 lb 14.4 oz (131.498 kg)      Other studies Reviewed: Additional studies/ records that were reviewed today include: . Review of the above records demonstrates:    ASSESSMENT AND PLAN:  1.  Mild chronic systolic congestive heart failure: Ross [name] presents with an ejection fraction of 40-45%. This was noted on echocardiogram. He presented to the hospital with recurrent pulmonary emboli so he has had some short of breath.  On medical therapy his breathing  has improved quite a bit. He's been able to walk several miles every day without any problems.  We'll continue the same medications. We will get a stress Myoview study for further evaluation for the possible ischemia. I will see him again in 3 months for follow-up visit.  2. Recurrent pulmonary emboli. He's had a very aggressive treatment for his pulmonary emboli.   He received one full month of Lovenox and now is on Xarelto 15 mg twice a day. He'll lower his Xarelto dose to 20 mg a day after 21 days.  He likely has a hypercoagulable state related to his chronic lymphocytic leukemia. I've asked him to consult with his hematologist/oncologist for further advise regarding this.  He will likely need lifelong anticoagulation .   I'll see him in 3 months for follow-up visit.  Current medicines are reviewed at length with the patient today.  The patient does not have concerns regarding medicines.  The following changes have been made:  no change  Labs/ tests ordered today include:  No orders of the defined types were placed in this encounter.     Disposition:   FU with me in 3 months      Nahser, Wonda Cheng, MD  04/19/2015 4:34 PM    [facility] [place], [place], [place]  [address] Phone: [phone]; Fax: [phone]   [facility]  [address] [place], [place]  [address] [phone]   Fax [phone]

## 2015-04-20 ENCOUNTER — Telehealth (HOSPITAL_COMMUNITY): Payer: Self-pay | Admitting: *Deleted

## 2015-04-20 ENCOUNTER — Encounter: Payer: Self-pay | Admitting: Cardiovascular Disease

## 2015-04-20 ENCOUNTER — Ambulatory Visit (INDEPENDENT_AMBULATORY_CARE_PROVIDER_SITE_OTHER): Payer: Commercial Managed Care - PPO | Admitting: Cardiovascular Disease

## 2015-04-20 VITALS — BP 102/80 | HR 72 | Ht 74.0 in | Wt 295.8 lb

## 2015-04-20 DIAGNOSIS — I2782 Chronic pulmonary embolism: Secondary | ICD-10-CM

## 2015-04-20 DIAGNOSIS — I2699 Other pulmonary embolism without acute cor pulmonale: Secondary | ICD-10-CM

## 2015-04-20 DIAGNOSIS — I5022 Chronic systolic (congestive) heart failure: Secondary | ICD-10-CM | POA: Insufficient documentation

## 2015-04-20 DIAGNOSIS — C9110 Chronic lymphocytic leukemia of B-cell type not having achieved remission: Secondary | ICD-10-CM | POA: Diagnosis not present

## 2015-04-20 HISTORY — DX: Chronic systolic (congestive) heart failure: I50.22

## 2015-04-20 NOTE — Patient Instructions (Signed)
Medication Instructions:  Your physician recommends that you continue on your current medications as directed. Please refer to the Current Medication list given to you today.   Labwork: None Ordered   Testing/Procedures: Your physician has requested that you have an exercise stress myoview. For further information please visit [email or web address]. Please follow instruction sheet, as given.    Follow-Up: Your physician recommends that you schedule a follow-up appointment in: 3 months with Dr. Nahser   If you need a refill on your cardiac medications before your next appointment, please call your pharmacy.   Thank you for choosing [facility]! Michelle Swinyer, RN [phone]    

## 2015-04-20 NOTE — Telephone Encounter (Signed)
Left message on voicemail in reference to upcoming appointment scheduled for 04/27/15. Phone number given for a call back so details instructions can be given. Ronin Crager, [name]

## 2015-04-21 ENCOUNTER — Telehealth (HOSPITAL_COMMUNITY): Payer: Self-pay | Admitting: *Deleted

## 2015-04-21 NOTE — Telephone Encounter (Addendum)
Patient given detailed instructions per Myocardial Perfusion Study Information Sheet for the test on 04/27/15 at 1230.I also informed patient that his test was being done in two days. Patient notified to arrive 15 minutes early and that it is imperative to arrive on time for appointment to keep from having the test rescheduled.  If you need to cancel or reschedule your appointment, please call the office within 24 hours of your appointment. Failure to do so may result in a cancellation of your appointment, and a $50 no show fee.Patient then stated well how much can I charge you for multiple phone calls,emails and appointment reminders wasting his time. I stated that I had only called 2x. I could not leave a voicemail with instructions at home # or work # because there was no DPR in epic. He stated well I was there at your office yesterday and got instructions so why am I wasting his time. He stated did we think our patients were a bunch of idiots. I informed him that when some of these patients are scheduled,given instructions by office staff that some instructions are incorrect.I stated our test has specific instructions that we need to discuss with patients and I had [facility] yesterday and received no call back before I left at 430pm. He stated he called back but it was after we left. So our procedure in nuc is to attempt once more. He apologized but then stated that he was recently in hospital and he had to contact president of hosp and vp of nursing. He stated the communication of this hospital was the worse that he had ever seen. I stated I am sorry he had that experience. He wanted to know whom he could contact about our office communication and I stated Everlean Patterson is our Glass blower/designer. I also informed patient that nuclear works for [employer] and not [facility]. Patient again apologized but I could still hear the anger in his voice. I informed my supervisor of this phone call.Patient verbalized  understanding.Shoji Pertuit, [name]

## 2015-04-21 NOTE — Telephone Encounter (Signed)
Left message on voicemail in reference to upcoming appointment scheduled for 04/27/15. Phone number given for a call back so details instructions can be given. Ross [name], [name]

## 2015-04-27 ENCOUNTER — Ambulatory Visit (HOSPITAL_COMMUNITY): Payer: Commercial Managed Care - PPO | Attending: Internal Medicine

## 2015-04-27 DIAGNOSIS — I509 Heart failure, unspecified: Secondary | ICD-10-CM | POA: Diagnosis not present

## 2015-04-27 DIAGNOSIS — I517 Cardiomegaly: Secondary | ICD-10-CM | POA: Insufficient documentation

## 2015-04-27 DIAGNOSIS — I5022 Chronic systolic (congestive) heart failure: Secondary | ICD-10-CM | POA: Diagnosis not present

## 2015-04-27 DIAGNOSIS — I2782 Chronic pulmonary embolism: Secondary | ICD-10-CM

## 2015-04-27 DIAGNOSIS — R0609 Other forms of dyspnea: Secondary | ICD-10-CM | POA: Insufficient documentation

## 2015-04-27 LAB — MYOCARDIAL PERFUSION IMAGING
CHL CUP RESTING HR STRESS: 64 {beats}/min
CSEPPHR: 104 {beats}/min

## 2015-04-27 MED ORDER — TECHNETIUM TC 99M SESTAMIBI GENERIC - CARDIOLITE
32.8000 | Freq: Once | INTRAVENOUS | Status: AC | PRN
  Administered 2015-04-27: 32.8 via INTRAVENOUS

## 2015-04-27 MED ORDER — REGADENOSON 0.4 MG/5ML IV SOLN
0.4000 mg | Freq: Once | INTRAVENOUS | Status: AC
  Administered 2015-04-27: 0.4 mg via INTRAVENOUS

## 2015-04-28 ENCOUNTER — Other Ambulatory Visit: Payer: Self-pay | Admitting: Cardiovascular Disease

## 2015-04-28 ENCOUNTER — Ambulatory Visit (HOSPITAL_COMMUNITY): Payer: Commercial Managed Care - PPO | Attending: Cardiovascular Disease

## 2015-04-28 DIAGNOSIS — I509 Heart failure, unspecified: Secondary | ICD-10-CM

## 2015-04-28 LAB — MYOCARDIAL PERFUSION IMAGING
CHL CUP NUCLEAR SRS: 9
CHL CUP NUCLEAR SSS: 9
CSEPPHR: 104 {beats}/min
LVDIAVOL: 225 mL
LVSYSVOL: 124 mL
NUC STRESS TID: 1.2
RATE: 0.33
Rest HR: 64 {beats}/min
SDS: 0

## 2015-04-28 MED ORDER — TECHNETIUM TC 99M SESTAMIBI GENERIC - CARDIOLITE
31.7000 | Freq: Once | INTRAVENOUS | Status: AC | PRN
  Administered 2015-04-28: 31.7 via INTRAVENOUS

## 2015-05-10 ENCOUNTER — Encounter: Payer: Self-pay | Admitting: Internal Medicine

## 2015-05-10 ENCOUNTER — Encounter: Payer: Self-pay | Admitting: Cardiovascular Disease

## 2015-05-11 MED ORDER — RIVAROXABAN 20 MG PO TABS: 20.0000 mg | ORAL_TABLET | Freq: Every day | ORAL | Status: DC

## 2015-05-12 ENCOUNTER — Encounter: Payer: Self-pay | Admitting: Cardiovascular Disease

## 2015-05-12 ENCOUNTER — Other Ambulatory Visit: Payer: Self-pay | Admitting: Nurse Practitioner

## 2015-05-12 MED ORDER — METOPROLOL SUCCINATE ER 50 MG PO TB24: 50.0000 mg | ORAL_TABLET | Freq: Every day | ORAL | Status: DC

## 2015-07-27 ENCOUNTER — Encounter: Payer: Self-pay | Admitting: Cardiovascular Disease

## 2015-07-27 ENCOUNTER — Ambulatory Visit (INDEPENDENT_AMBULATORY_CARE_PROVIDER_SITE_OTHER): Payer: Commercial Managed Care - PPO | Admitting: Cardiovascular Disease

## 2015-07-27 VITALS — BP 114/82 | HR 72 | Ht 74.0 in | Wt 294.4 lb

## 2015-07-27 DIAGNOSIS — I2699 Other pulmonary embolism without acute cor pulmonale: Secondary | ICD-10-CM | POA: Diagnosis not present

## 2015-07-27 DIAGNOSIS — I5022 Chronic systolic (congestive) heart failure: Secondary | ICD-10-CM

## 2015-07-27 MED ORDER — LOSARTAN POTASSIUM 25 MG PO TABS: 25.0000 mg | ORAL_TABLET | Freq: Every day | ORAL | Status: DC

## 2015-07-27 NOTE — Progress Notes (Signed)
Cardiology Office Note   Date:  07/27/2015   ID:  Ross [name], DOB [birth year], MRN [id number]  PCP:  Harle Battiest, MD  Cardiologist:   Thayer Headings, MD   Chief Complaint  Patient presents with  . Congestive Heart Failure    f/u   Problem List 1. Recurrent Pulmonary Emboli 2. Chronic systolic CHF 3. PVCs     Nov. 20, 2016:  History of Present Illness: Ross [name] is a 60 y.o. male who presents for follow up of his recent hospitalization for recurrent pulmonary emboli .   Was found to have mildly decreased LV systolic function .   Was discharged with Lovenox for 1 month.  Now is on Xarelto 15 mg BID and is scheduled to change to Xarelto 20 mg a day following 21 days of the 15 BID dosing .  Marland Kitchen  This recommendation was given by hematology .   Has seen by Dr. Melvyn Novas.    Has done well since his DC.  No CP .  Breathing was ok  Feb. 27, 2017  Ross [name] is seen for follow up for his CHF and pulmonary emboli.   Was seen with his wife , Ross [name].    They have a house up in [place], [place].   He went to [place].  [place].  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  His Breathing is good. Walks and does yard work occasionally .  No Cp , no dyspnea   Past Medical History  Diagnosis Date  . Acute meniscal tear of knee LEFT  . PE (pulmonary embolism)     a. Multiple bilateral PEs on CT 07/2014. Initially tx with Lovenox/Xarelto x 6 months, but readmitted 02/2015 with recurrent acute on chronic PE.  Marland Kitchen CLL (chronic lymphocytic leukemia) ([place]) 03/16/2015    a. CLL/SLL followed at [facility].  . DVT of leg (deep venous thrombosis) ([place])   . Habitual alcohol use   . PVC's (premature ventricular contractions)     a. Frequent, noted on telemetry during 02/2015 admisson - also seen on EKG in 07/2014.  . Cardiomyopathy ([place])     a. Dx 02/2015: EF 40-45% with continued RV dysfunction as well.  . PE (pulmonary  embolism) 03/17/2015  . Chronic systolic CHF (congestive heart failure) ([place]) 04/20/2015    Past Surgical History  Procedure Laterality Date  . Left inguinal hernia repair w/ mesh  06-10-2005  . Fatty cyst removed from flank and back      FLANK- 15 YRS AGO/  BACK 12 YRS AGO  . Knee arthroscopy  07/22/2011    Procedure: ARTHROSCOPY KNEE;  Surgeon: Magnus Sinning, MD;  Location: [facility];  Service: Orthopedics;  Laterality: Left;  Partial medial menisectomy and shaving of the patella  . Tonsillectomy       Current Outpatient Prescriptions  Medication Sig Dispense Refill  . Investigational - Study Medication Additional Study Details: Placebo vs Fish oil with Vit D- 2 capsules daily    . metoprolol succinate (TOPROL-XL) 50 MG 24 hr tablet Take 1 tablet (50 mg total) by mouth daily. 90 tablet 3  . rivaroxaban (XARELTO) 20 MG TABS tablet Take 1 tablet (20 mg total) by mouth daily with supper. 30 tablet 5   No current facility-administered medications for this visit.    Allergies:   Review of patient's allergies indicates no known allergies.    Social History:  The patient  reports that he has never smoked. He has never used smokeless tobacco. He reports that he drinks alcohol. He reports that he does not use illicit drugs.   Family History:  The patient's family history includes Cancer in his father. There is no history of CAD.    ROS:  Please see the history of present illness.    Review of Systems: Constitutional:  denies fever, chills, diaphoresis, appetite change and fatigue.  HEENT: denies photophobia, eye pain, redness, hearing loss, ear pain, congestion, sore throat, rhinorrhea, sneezing, neck pain, neck stiffness and tinnitus.  Respiratory: denies SOB, DOE, cough, chest tightness, and wheezing.  Cardiovascular: denies chest pain, palpitations and leg swelling.  Gastrointestinal: denies nausea, vomiting, abdominal pain, diarrhea, constipation, blood in  stool.  Genitourinary: denies dysuria, urgency, frequency, hematuria, flank pain and difficulty urinating.  Musculoskeletal: denies  myalgias, back pain, joint swelling, arthralgias and gait problem.   Skin: denies pallor, rash and wound.  Neurological: denies dizziness, seizures, syncope, weakness, light-headedness, numbness and headaches.   Hematological: denies adenopathy, easy bruising, personal or family bleeding history.  Psychiatric/ Behavioral: denies suicidal ideation, mood changes, confusion, nervousness, sleep disturbance and agitation.       All other systems are reviewed and negative.    PHYSICAL EXAM: VS:  BP 114/82 mmHg  Pulse 72  Ht 6\' 2"  (1.88 m)  Wt 294 lb 6.4 oz (133.539 kg)  BMI 37.78 kg/m2 , BMI Body mass index is 37.78 kg/(m^2). GEN: Well nourished, well developed, in no acute distress HEENT: normal Neck: no JVD, carotid bruits, or masses Cardiac: RRR; no murmurs, rubs, or gallops,no edema  Respiratory:  clear to auscultation bilaterally, normal work of breathing GI: soft, nontender, nondistended, + BS MS: no deformity or atrophy Skin: warm and dry, no rash Neuro:  Strength and sensation are intact Psych: normal   EKG:  EKG is not ordered today.  Recent Labs: 09/01/2014: ALT 19 03/17/2015: Magnesium 2.2; TSH 4.114 03/18/2015: BUN 9; Creatinine, Ser 0.92; Hemoglobin 17.2*; Platelets 113*; Potassium 4.7; Sodium 136    Lipid Panel    Component Value Date/Time   CHOL 157 03/18/2015 0330   TRIG 104 03/18/2015 0330   HDL 33* 03/18/2015 0330   CHOLHDL 4.8 03/18/2015 0330   VLDL 21 03/18/2015 0330   LDLCALC 103* 03/18/2015 0330      Wt Readings from Last 3 Encounters:  07/27/15 294 lb 6.4 oz (133.539 kg)  04/27/15 295 lb (133.811 kg)  04/20/15 295 lb 12.8 oz (134.174 kg)      Other studies Reviewed: Additional studies/ records that were reviewed today include: . Review of the above records demonstrates:    ASSESSMENT AND PLAN:  1.  Mild  chronic systolic congestive heart failure: Ross [name] presents with an ejection fraction of 40-45%. This was noted on echocardiogram. He presented to the hospital with recurrent pulmonary emboli so he has had some short of breath. Myoview study was negative for ischemia.   On medical therapy his breathing has improved quite a bit. He's been able to walk several miles every day without any problems. Will see him back in 3 months   We'll continue the same medications.   2. Recurrent pulmonary emboli. He's had a very aggressive treatment for his pulmonary emboli.   He received one full month of Lovenox and now is on Xarelto 15 mg twice a day. He'll lower his Xarelto dose to 20 mg a day after 21 days.  He likely has a hypercoagulable state related  to his chronic lymphocytic leukemia. I've asked him to consult with his hematologist/oncologist for further advise regarding this.  He will likely need lifelong anticoagulation .   I'll see him in 6 months for follow-up visit.  Current medicines are reviewed at length with the patient today.  The patient does not have concerns regarding medicines.  The following changes have been made:  no change  Labs/ tests ordered today include:  No orders of the defined types were placed in this encounter.    Disposition:   FU with me in 3 months     Nahser, Wonda Cheng, MD  07/27/2015 7:59 AM    [facility] [place], [place], [place]  [address] Phone: [phone]; Fax: [phone]   [facility]  [address] [place], [place]  [address] [phone]   Fax [phone]

## 2015-07-27 NOTE — Patient Instructions (Signed)
Medication Instructions:  START Losartan 25 mg once daily   Labwork: Your physician recommends that you return for lab work in: 3 weeks for basic metabolic panel    Testing/Procedures: None Ordered   Follow-Up: Your physician recommends that you schedule a follow-up appointment in: 3 months with Dr. Acie Fredrickson.    If you need a refill on your cardiac medications before your next appointment, please call your pharmacy.   Thank you for choosing [facility]! Christen Bame, RN [phone]

## 2015-08-17 ENCOUNTER — Other Ambulatory Visit (INDEPENDENT_AMBULATORY_CARE_PROVIDER_SITE_OTHER): Payer: Commercial Managed Care - PPO | Admitting: *Deleted

## 2015-08-17 DIAGNOSIS — I5022 Chronic systolic (congestive) heart failure: Secondary | ICD-10-CM

## 2015-08-17 LAB — BASIC METABOLIC PANEL
BUN: 12 mg/dL (ref 7–25)
CHLORIDE: 106 mmol/L (ref 98–110)
CO2: 26 mmol/L (ref 20–31)
Calcium: 8.9 mg/dL (ref 8.6–10.3)
Creat: 0.75 mg/dL (ref 0.70–1.33)
Glucose, Bld: 93 mg/dL (ref 65–99)
POTASSIUM: 4.7 mmol/L (ref 3.5–5.3)
SODIUM: 142 mmol/L (ref 135–146)

## 2015-10-22 ENCOUNTER — Encounter: Payer: Self-pay | Admitting: Cardiovascular Disease

## 2015-10-27 ENCOUNTER — Encounter: Payer: Self-pay | Admitting: Cardiovascular Disease

## 2015-10-27 ENCOUNTER — Ambulatory Visit (INDEPENDENT_AMBULATORY_CARE_PROVIDER_SITE_OTHER): Payer: Commercial Managed Care - PPO | Admitting: Cardiovascular Disease

## 2015-10-27 VITALS — BP 138/92 | HR 62 | Ht 74.0 in | Wt 288.0 lb

## 2015-10-27 DIAGNOSIS — I2699 Other pulmonary embolism without acute cor pulmonale: Secondary | ICD-10-CM

## 2015-10-27 DIAGNOSIS — I5022 Chronic systolic (congestive) heart failure: Secondary | ICD-10-CM

## 2015-10-27 MED ORDER — LOSARTAN POTASSIUM 50 MG PO TABS: 50.0000 mg | ORAL_TABLET | Freq: Every day | ORAL | Status: DC

## 2015-10-27 NOTE — Progress Notes (Signed)
Cardiology Office Note   Date:  10/27/2015   ID:  Ross [name], DOB October 28, 1955, MRN [id number]  PCP:   Melinda Crutch, MD  Cardiologist:   Mertie Moores, MD   Chief Complaint  Patient presents with  . Follow-up    CHF   Problem List 1. Recurrent Pulmonary Emboli 2. Chronic systolic CHF 3. PVCs     Nov. 20, 2016:  History of Present Illness: Ross [name] is a 60 y.o. male who presents for follow up of his recent hospitalization for recurrent pulmonary emboli .   Was found to have mildly decreased LV systolic function .   Was discharged with Lovenox for 1 month.  Now is on Xarelto 15 mg BID and is scheduled to change to Xarelto 20 mg a day following 21 days of the 15 BID dosing .  Marland Kitchen  This recommendation was given by hematology .   Has seen by Dr. Melvyn Novas.    Has done well since his DC.  No CP .  Breathing was ok  Feb. 27, 2017  Ross [name] is seen for follow up for his CHF and pulmonary emboli.   Was seen with his wife , Ross [name].    They have a house up in [place], [place].   He went to [place].  They go [place] to the mountains frequently.  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  His Breathing is good. Walks and does yard work occasionally .  No Cp , no dyspnea  Oct 27, 2015  Ross [name] is seen today for follow up of his chronic systolic CHF - [id number].  Also has hx of pulmonary embolus. He and Ross [name] are moving to [place]. Able to do lots of work without issues.   No CP or dyspnea   Past Medical History  Diagnosis Date  . Acute meniscal tear of knee LEFT  . PE (pulmonary embolism)     a. Multiple bilateral PEs on CT 07/2014. Initially tx with Lovenox/Xarelto x 6 months, but readmitted 02/2015 with recurrent acute on chronic PE.  Marland Kitchen CLL (chronic lymphocytic leukemia) ([place]) 03/16/2015    a. CLL/SLL followed at [facility].  . DVT of leg (deep venous thrombosis) ([place])   . Habitual alcohol use   . PVC's (premature ventricular  contractions)     a. Frequent, noted on telemetry during 02/2015 admisson - also seen on EKG in 07/2014.  . Cardiomyopathy ([place])     a. Dx 02/2015: EF 40-45% with continued RV dysfunction as well.  . PE (pulmonary embolism) 03/17/2015  . Chronic systolic CHF (congestive heart failure) ([place]) 04/20/2015    Past Surgical History  Procedure Laterality Date  . Left inguinal hernia repair w/ mesh  06-10-2005  . Fatty cyst removed from flank and back      FLANK- 15 YRS AGO/  BACK 12 YRS AGO  . Knee arthroscopy  07/22/2011    Procedure: ARTHROSCOPY KNEE;  Surgeon: Magnus Sinning, MD;  Location: [facility];  Service: Orthopedics;  Laterality: Left;  Partial medial menisectomy and shaving of the patella  . Tonsillectomy       Current Outpatient Prescriptions  Medication Sig Dispense Refill  . Investigational - Study Medication Additional Study Details: Placebo vs Fish oil with Vit D- 2 capsules daily    . losartan (COZAAR) 25 MG tablet Take 1 tablet (25 mg total) by mouth daily. 30 tablet 11  . metoprolol succinate (TOPROL-XL) 50  MG 24 hr tablet Take 1 tablet (50 mg total) by mouth daily. 90 tablet 3  . rivaroxaban (XARELTO) 20 MG TABS tablet Take 1 tablet (20 mg total) by mouth daily with supper. 30 tablet 5   No current facility-administered medications for this visit.    Allergies:   Review of patient's allergies indicates no known allergies.    Social History:  The patient  reports that he has never smoked. He has never used smokeless tobacco. He reports that he drinks alcohol. He reports that he does not use illicit drugs.   Family History:  The patient's family history includes Cancer in his father. There is no history of CAD.    ROS:  Please see the history of present illness.    Review of Systems: Constitutional:  denies fever, chills, diaphoresis, appetite change and fatigue.  HEENT: denies photophobia, eye pain, redness, hearing loss, ear pain, congestion,  sore throat, rhinorrhea, sneezing, neck pain, neck stiffness and tinnitus.  Respiratory: denies SOB, DOE, cough, chest tightness, and wheezing.  Cardiovascular: denies chest pain, palpitations and leg swelling.  Gastrointestinal: denies nausea, vomiting, abdominal pain, diarrhea, constipation, blood in stool.  Genitourinary: denies dysuria, urgency, frequency, hematuria, flank pain and difficulty urinating.  Musculoskeletal: denies  myalgias, back pain, joint swelling, arthralgias and gait problem.   Skin: denies pallor, rash and wound.  Neurological: denies dizziness, seizures, syncope, weakness, light-headedness, numbness and headaches.   Hematological: denies adenopathy, easy bruising, personal or family bleeding history.  Psychiatric/ Behavioral: denies suicidal ideation, mood changes, confusion, nervousness, sleep disturbance and agitation.       All other systems are reviewed and negative.    PHYSICAL EXAM: VS:  BP 138/92 mmHg  Pulse 62  Ht 6\' 2"  (1.88 m)  Wt 288 lb (130.636 kg)  BMI 36.96 kg/m2 , BMI Body mass index is 36.96 kg/(m^2). GEN: Well nourished, well developed, in no acute distress HEENT: normal Neck: no JVD, carotid bruits, or masses Cardiac: RRR; no murmurs, rubs, or gallops,no edema  Respiratory:  clear to auscultation bilaterally, normal work of breathing GI: soft, nontender, nondistended, + BS MS: no deformity or atrophy Skin: warm and dry, no rash Neuro:  Strength and sensation are intact Psych: normal   EKG:  EKG is not ordered today.  Recent Labs: 03/17/2015: Magnesium 2.2; TSH 4.114 03/18/2015: Hemoglobin 17.2*; Platelets 113* 08/17/2015: BUN 12; Creat 0.75; Potassium 4.7; Sodium 142    Lipid Panel    Component Value Date/Time   CHOL 157 03/18/2015 0330   TRIG 104 03/18/2015 0330   HDL 33* 03/18/2015 0330   CHOLHDL 4.8 03/18/2015 0330   VLDL 21 03/18/2015 0330   LDLCALC 103* 03/18/2015 0330      Wt Readings from Last 3 Encounters:    10/27/15 288 lb (130.636 kg)  07/27/15 294 lb 6.4 oz (133.539 kg)  04/27/15 295 lb (133.811 kg)      Other studies Reviewed: Additional studies/ records that were reviewed today include: . Review of the above records demonstrates:    ASSESSMENT AND PLAN:  1.  Mild chronic systolic congestive heart failure: Ross [name] presents with an ejection fraction of 40-45%. This was noted on echocardiogram. He presented to the hospital with recurrent pulmonary emboli so he has had some short of breath. Myoview study was negative for ischemia.   On medical therapy his breathing has improved quite a bit. He's been able to walk several miles every day without any problems.  Will increase the Losartan to 50 mg a  day . Will consider adding Aldactone at his next visit.   Will see him back in 3 months   We'll continue the same medications.   2. Recurrent pulmonary emboli. Continue  Xarelto dose  20 mg a day     I'll see him in 3 months for follow-up visit.  Current medicines are reviewed at length with the patient today.  The patient does not have concerns regarding medicines.  The following changes have been made:  no change  Labs/ tests ordered today include:  No orders of the defined types were placed in this encounter.      Mertie Moores, MD  10/27/2015 8:15 AM    [facility] [place], [place], [place]  [address] Phone: [phone]; Fax: [phone]   [facility]  [address] [place], [place]  [address] [phone]   Fax [phone]

## 2015-10-27 NOTE — Patient Instructions (Signed)
Medication Instructions:  INCREASE Losartan to 50 mg once daily   Labwork: Your physician recommends that you return for lab work in: 3 weeks for basic metabolic panel   Testing/Procedures: None Ordered   Follow-Up: Your physician recommends that you schedule a follow-up appointment in: 3 months with Dr. Acie Fredrickson.    If you need a refill on your cardiac medications before your next appointment, please call your pharmacy.   Thank you for choosing [facility]! Christen Bame, RN [phone]

## 2015-11-02 ENCOUNTER — Other Ambulatory Visit: Payer: Self-pay | Admitting: Internal Medicine

## 2015-11-16 ENCOUNTER — Other Ambulatory Visit (INDEPENDENT_AMBULATORY_CARE_PROVIDER_SITE_OTHER): Payer: Commercial Managed Care - PPO | Admitting: *Deleted

## 2015-11-16 DIAGNOSIS — I493 Ventricular premature depolarization: Secondary | ICD-10-CM

## 2015-11-16 DIAGNOSIS — I429 Cardiomyopathy, unspecified: Secondary | ICD-10-CM | POA: Diagnosis not present

## 2015-11-16 LAB — BASIC METABOLIC PANEL
BUN: 13 mg/dL (ref 7–25)
CHLORIDE: 104 mmol/L (ref 98–110)
CO2: 30 mmol/L (ref 20–31)
Calcium: 9.1 mg/dL (ref 8.6–10.3)
Creat: 0.84 mg/dL (ref 0.70–1.33)
Glucose, Bld: 97 mg/dL (ref 65–99)
Potassium: 4.8 mmol/L (ref 3.5–5.3)
SODIUM: 142 mmol/L (ref 135–146)

## 2015-11-16 NOTE — Addendum Note (Signed)
Addended by: Eulis Foster on: 11/16/2015 07:58 AM   Modules accepted: Orders

## 2016-01-14 ENCOUNTER — Encounter: Payer: Self-pay | Admitting: Cardiovascular Disease

## 2016-01-28 ENCOUNTER — Ambulatory Visit: Payer: Commercial Managed Care - PPO | Admitting: Cardiovascular Disease

## 2016-02-16 ENCOUNTER — Other Ambulatory Visit: Payer: Self-pay | Admitting: *Deleted

## 2016-02-16 ENCOUNTER — Encounter: Payer: Self-pay | Admitting: Cardiovascular Disease

## 2016-02-16 MED ORDER — METOPROLOL SUCCINATE ER 50 MG PO TB24: 50.0000 mg | ORAL_TABLET | Freq: Every day | ORAL | 0 refills | Status: DC

## 2016-03-21 ENCOUNTER — Encounter: Payer: Self-pay | Admitting: Cardiovascular Disease

## 2016-03-21 ENCOUNTER — Ambulatory Visit (INDEPENDENT_AMBULATORY_CARE_PROVIDER_SITE_OTHER): Payer: Commercial Managed Care - PPO | Admitting: Cardiovascular Disease

## 2016-03-21 VITALS — BP 122/90 | HR 79 | Ht 74.0 in | Wt 294.0 lb

## 2016-03-21 DIAGNOSIS — I5022 Chronic systolic (congestive) heart failure: Secondary | ICD-10-CM

## 2016-03-21 DIAGNOSIS — I2690 Septic pulmonary embolism without acute cor pulmonale: Secondary | ICD-10-CM | POA: Diagnosis not present

## 2016-03-21 DIAGNOSIS — I493 Ventricular premature depolarization: Secondary | ICD-10-CM

## 2016-03-21 LAB — TSH: TSH: 2.33 mIU/L (ref 0.40–4.50)

## 2016-03-21 MED ORDER — METOPROLOL SUCCINATE ER 100 MG PO TB24: 100.0000 mg | ORAL_TABLET | Freq: Every day | ORAL | 3 refills | Status: DC

## 2016-03-21 NOTE — Progress Notes (Signed)
Cardiology Office Note   Date:  03/21/2016   ID:  Ross [name], DOB August 23, 1955, MRN [id number]  PCP:  Melinda Crutch, MD  Cardiologist:   Mertie Moores, MD   Chief Complaint  Patient presents with  . Follow-up    CHF   Problem List 1. Recurrent Pulmonary Emboli 2. Chronic systolic CHF 3. PVCs     Nov. 20, 2016:    Ross [name] is a 60 y.o. male who presents for follow up of his recent hospitalization for recurrent pulmonary emboli .   Was found to have mildly decreased LV systolic function .   Was discharged with Lovenox for 1 month.  Now is on Xarelto 15 mg BID and is scheduled to change to Xarelto 20 mg a day following 21 days of the 15 BID dosing .  Marland Kitchen  This recommendation was given by hematology .   Has seen by Dr. Melvyn Novas.    Has done well since his DC.  No CP .  Breathing was ok  Feb. 27, 2017  Ross [name] is seen for follow up for his CHF and pulmonary emboli.   Was seen with his wife , [name].    They have a house up in [place], [place].   He went to [place].  They go [place] to the mountains frequently.  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  His Breathing is good. Walks and does yard work occasionally .  No Cp , no dyspnea  Oct 27, 2015  Ross [name] is seen today for follow up of his chronic systolic CHF - [id number].  Also has hx of pulmonary embolus. He and [name] are moving to [place]. Able to do lots of work without issues.   No CP or dyspnea   Oct. 23, 2017:  Ross [name] is seen today for follow up of his CHF and pulmonary embolus  Still splitting time between here and [place] BP and HR are well controlled.   Getting some exercise - walking 3-5 miles a day .   Walks the roads  Has frequent PVCs on ECG He cannot feel them   Past Medical History:  Diagnosis Date  . Acute meniscal tear of knee LEFT  . Cardiomyopathy ([place])    a. Dx 02/2015: EF 40-45% with continued RV dysfunction as well.  . Chronic systolic  CHF (congestive heart failure) ([place]) 04/20/2015  . CLL (chronic lymphocytic leukemia) ([place]) 03/16/2015   a. CLL/SLL followed at [facility].  . DVT of leg (deep venous thrombosis) ([place])   . Habitual alcohol use   . PE (pulmonary embolism)    a. Multiple bilateral PEs on CT 07/2014. Initially tx with Lovenox/Xarelto x 6 months, but readmitted 02/2015 with recurrent acute on chronic PE.  . PE (pulmonary embolism) 03/17/2015  . PVC's (premature ventricular contractions)    a. Frequent, noted on telemetry during 02/2015 admisson - also seen on EKG in 07/2014.    Past Surgical History:  Procedure Laterality Date  . FATTY CYST REMOVED FROM FLANK AND BACK     FLANK- 15 YRS AGO/  BACK 12 YRS AGO  . KNEE ARTHROSCOPY  07/22/2011   Procedure: ARTHROSCOPY KNEE;  Surgeon: Magnus Sinning, MD;  Location: [place];  Service: Orthopedics;  Laterality: Left;  Partial medial menisectomy and shaving of the patella  . LEFT INGUINAL HERNIA REPAIR W/ MESH  06-10-2005  . TONSILLECTOMY       Current Outpatient Prescriptions  Medication Sig Dispense Refill  . Investigational - Study Medication Additional Study Details: Placebo vs Fish oil with Vit D- 2 capsules daily    . losartan (COZAAR) 50 MG tablet Take 1 tablet (50 mg total) by mouth daily. 30 tablet 11  . metoprolol succinate (TOPROL-XL) 50 MG 24 hr tablet Take 1 tablet (50 mg total) by mouth daily. 90 tablet 0  . XARELTO 20 MG TABS tablet TAKE 1 TABLET (20 MG TOTAL) BY MOUTH DAILY WITH SUPPER. 30 tablet 5   No current facility-administered medications for this visit.     Allergies:   Review of patient's allergies indicates no known allergies.    Social History:  The patient  reports that he has never smoked. He has never used smokeless tobacco. He reports that he drinks alcohol. He reports that he does not use drugs.   Family History:  The patient's family history includes Cancer in his father.    ROS:  Please see the history of  present illness.    Review of Systems: Constitutional:  denies fever, chills, diaphoresis, appetite change and fatigue.  HEENT: denies photophobia, eye pain, redness, hearing loss, ear pain, congestion, sore throat, rhinorrhea, sneezing, neck pain, neck stiffness and tinnitus.  Respiratory: denies SOB, DOE, cough, chest tightness, and wheezing.  Cardiovascular: denies chest pain, palpitations and leg swelling.  Gastrointestinal: denies nausea, vomiting, abdominal pain, diarrhea, constipation, blood in stool.  Genitourinary: denies dysuria, urgency, frequency, hematuria, flank pain and difficulty urinating.  Musculoskeletal: denies  myalgias, back pain, joint swelling, arthralgias and gait problem.   Skin: denies pallor, rash and wound.  Neurological: denies dizziness, seizures, syncope, weakness, light-headedness, numbness and headaches.   Hematological: denies adenopathy, easy bruising, personal or family bleeding history.  Psychiatric/ Behavioral: denies suicidal ideation, mood changes, confusion, nervousness, sleep disturbance and agitation.       All other systems are reviewed and negative.    PHYSICAL EXAM: VS:  BP 122/90 (BP Location: Left Arm, Patient Position: Sitting, Cuff Size: Large)   Pulse 79   Ht 6\' 2"  (1.88 m)   Wt 294 lb (133.4 kg)   BMI 37.75 kg/m  , BMI Body mass index is 37.75 kg/m. GEN: Well nourished, well developed, in no acute distress  HEENT: normal  Neck: no JVD, carotid bruits, or masses Cardiac: RRR with frequent PVCs ; no murmurs, rubs, or gallops,no edema  Respiratory:  clear to auscultation bilaterally, normal work of breathing GI: soft, nontender, nondistended, + BS MS: no deformity or atrophy  Skin: warm and dry, no rash Neuro:  Strength and sensation are intact Psych: normal   EKG:  EKG is ordered today.   NSR with frequent PVCs. LAD   Recent Labs: 11/16/2015: BUN 13; Creat 0.84; Potassium 4.8; Sodium 142    Lipid Panel    Component  Value Date/Time   CHOL 157 03/18/2015 0330   TRIG 104 03/18/2015 0330   HDL 33 (L) 03/18/2015 0330   CHOLHDL 4.8 03/18/2015 0330   VLDL 21 03/18/2015 0330   LDLCALC 103 (H) 03/18/2015 0330      Wt Readings from Last 3 Encounters:  03/21/16 294 lb (133.4 kg)  10/27/15 288 lb (130.6 kg)  07/27/15 294 lb 6.4 oz (133.5 kg)      Other studies Reviewed: Additional studies/ records that were reviewed today include: . Review of the above records demonstrates:    ASSESSMENT AND PLAN:  1.  Mild chronic systolic congestive heart failure: Neal presents with an ejection fraction of  40-45%. This was noted on echocardiogram. He presented to the hospital with recurrent pulmonary emboli so he has had some short of breath. Myoview study was negative for ischemia.   On medical therapy his breathing has improved quite a bit. He's been able to walk several miles every day without any problems.  Continue  Losartan to 50 mg a day . Will consider adding Aldactone at his next visit.   He has frequent premature ventricular contractions. It's possible that these PVCs are controlling to his heart failure. We will get a 24-hour Holter monitor in order to get some idea of his PVC burden. Will check TSH, BMP  Will see him back in 3 months   We'll continue the same medications.   2. Recurrent pulmonary emboli. Continue  Xarelto dose  20 mg a day    I'll see him in 3 months for follow-up visit.  Current medicines are reviewed at length with the patient today.  The patient does not have concerns regarding medicines.  The following changes have been made:  no change  Labs/ tests ordered today include:  No orders of the defined types were placed in this encounter.     Mertie Moores, MD  03/21/2016 3:51 PM    [facility] [place], [place], [place]  [address] Phone: [phone]; Fax: [phone]   [facility]  [address] [place],  [place]  [address] [phone]   Fax [phone]

## 2016-03-21 NOTE — Patient Instructions (Signed)
Medication Instructions:  INCREASE Toprol (Metoprolol) to 100 mg once daily   Labwork: TODAY - TSH, basic metabolic panel   Testing/Procedures: Your physician has requested that you have an echocardiogram. Echocardiography is a painless test that uses sound waves to create images of your heart. It provides your doctor with information about the size and shape of your heart and how well your heart's chambers and valves are working. This procedure takes approximately one hour. There are no restrictions for this procedure.  Your physician has recommended that you wear a holter monitor. Holter monitors are medical devices that record the heart's electrical activity. Doctors most often use these monitors to diagnose arrhythmias. Arrhythmias are problems with the speed or rhythm of the heartbeat. The monitor is a small, portable device. You can wear one while you do your normal daily activities. This is usually used to diagnose what is causing palpitations/syncope (passing out).   Follow-Up: Your physician recommends that you schedule a follow-up appointment in: 3 months with Dr. Acie Fredrickson   If you need a refill on your cardiac medications before your next appointment, please call your pharmacy.   Thank you for choosing [facility]! Christen Bame, RN [phone]

## 2016-03-22 LAB — BASIC METABOLIC PANEL
BUN: 20 mg/dL (ref 7–25)
CO2: 24 mmol/L (ref 20–31)
Calcium: 9.4 mg/dL (ref 8.6–10.3)
Chloride: 108 mmol/L (ref 98–110)
Creat: 0.88 mg/dL (ref 0.70–1.33)
GLUCOSE: 98 mg/dL (ref 65–99)
POTASSIUM: 4.6 mmol/L (ref 3.5–5.3)
SODIUM: 141 mmol/L (ref 135–146)

## 2016-04-11 ENCOUNTER — Other Ambulatory Visit: Payer: Self-pay

## 2016-04-11 ENCOUNTER — Ambulatory Visit (HOSPITAL_COMMUNITY): Payer: Commercial Managed Care - PPO | Attending: Cardiology

## 2016-04-11 ENCOUNTER — Ambulatory Visit (INDEPENDENT_AMBULATORY_CARE_PROVIDER_SITE_OTHER): Payer: Commercial Managed Care - PPO

## 2016-04-11 DIAGNOSIS — I493 Ventricular premature depolarization: Secondary | ICD-10-CM | POA: Diagnosis not present

## 2016-04-11 DIAGNOSIS — I5022 Chronic systolic (congestive) heart failure: Secondary | ICD-10-CM

## 2016-04-20 ENCOUNTER — Telehealth (HOSPITAL_COMMUNITY): Payer: Self-pay | Admitting: Cardiovascular Disease

## 2016-04-20 NOTE — Telephone Encounter (Signed)
New Message:  Pt called in stating that he got a call from our office about getting scheduled with an EP doctor..Please call patient  Thanks

## 2016-04-30 ENCOUNTER — Other Ambulatory Visit: Payer: Self-pay | Admitting: Cardiovascular Disease

## 2016-05-02 NOTE — Telephone Encounter (Signed)
Medication Detail    Disp Refills Start End   metoprolol succinate (TOPROL XL) 100 MG 24 hr tablet 90 tablet 3 03/21/2016    Sig - Route: Take 1 tablet (100 mg total) by mouth daily. Take with or immediately following a meal. - Oral   E-Prescribing Status: Receipt confirmed by pharmacy (03/21/2016 4:11 PM EDT)   Pharmacy   [id number] - [place], [place] - [address]

## 2016-05-10 ENCOUNTER — Other Ambulatory Visit: Payer: Self-pay | Admitting: Internal Medicine

## 2016-05-10 ENCOUNTER — Ambulatory Visit (INDEPENDENT_AMBULATORY_CARE_PROVIDER_SITE_OTHER): Payer: Commercial Managed Care - PPO | Admitting: Internal Medicine

## 2016-05-10 ENCOUNTER — Encounter: Payer: Self-pay | Admitting: Internal Medicine

## 2016-05-10 VITALS — BP 114/80 | HR 72 | Ht 74.0 in | Wt 293.4 lb

## 2016-05-10 DIAGNOSIS — I493 Ventricular premature depolarization: Secondary | ICD-10-CM | POA: Diagnosis not present

## 2016-05-10 NOTE — Patient Instructions (Signed)
Medication Instructions:  Your physician recommends that you continue on your current medications as directed. Please refer to the Current Medication list given to you today.   Labwork: None Ordered   Testing/Procedures: None Ordered   Follow-Up: Follow-up with Dr. Taylor as needed    Any Other Special Instructions Will Be Listed Below (If Applicable).     If you need a refill on your cardiac medications before your next appointment, please call your pharmacy.   

## 2016-05-10 NOTE — Progress Notes (Signed)
HPI Ross [name] is referred today by Dr. Acie Fredrickson for evaluation of PVC's. He is a pleasant 60 yo man with a h/o CLL, pulmonary emboli which recurred and PVC's. He was found to have over 25K PVC's in a 24 hour period and is referred today to discuss treatment options. He is completely asymptomatic. No dizziness, chest pain, palpitations, near syncope, or sob. He has mild peripheral edema, likely from the DVT/PE. He had an echo which demonstrated an EF of 50-55% (previously 40-45%).  No Known Allergies   Current Outpatient Prescriptions  Medication Sig Dispense Refill  . Investigational - Study Medication Additional Study Details: Placebo vs Fish oil with Vit D- 2 capsules daily    . losartan (COZAAR) 50 MG tablet Take 1 tablet (50 mg total) by mouth daily. 30 tablet 11  . metoprolol succinate (TOPROL XL) 100 MG 24 hr tablet Take 1 tablet (100 mg total) by mouth daily. Take with or immediately following a meal. 90 tablet 3  . XARELTO 20 MG TABS tablet TAKE 1 TABLET (20 MG TOTAL) BY MOUTH DAILY WITH SUPPER. 30 tablet 5   No current facility-administered medications for this visit.      Past Medical History:  Diagnosis Date  . Acute meniscal tear of knee LEFT  . Cardiomyopathy ([place])    a. Dx 02/2015: EF 40-45% with continued RV dysfunction as well.  . Chronic systolic CHF (congestive heart failure) ([place]) 04/20/2015  . CLL (chronic lymphocytic leukemia) ([place]) 03/16/2015   a. CLL/SLL followed at [facility].  . DVT of leg (deep venous thrombosis) ([place])   . Habitual alcohol use   . PE (pulmonary embolism)    a. Multiple bilateral PEs on CT 07/2014. Initially tx with Lovenox/Xarelto x 6 months, but readmitted 02/2015 with recurrent acute on chronic PE.  . PE (pulmonary embolism) 03/17/2015  . PVC's (premature ventricular contractions)    a. Frequent, noted on telemetry during 02/2015 admisson - also seen on EKG in 07/2014.    ROS:   All systems reviewed and negative except as noted in  the HPI.   Past Surgical History:  Procedure Laterality Date  . FATTY CYST REMOVED FROM FLANK AND BACK     FLANK- 15 YRS AGO/  BACK 12 YRS AGO  . KNEE ARTHROSCOPY  07/22/2011   Procedure: ARTHROSCOPY KNEE;  Surgeon: Magnus Sinning, MD;  Location: [facility];  Service: Orthopedics;  Laterality: Left;  Partial medial menisectomy and shaving of the patella  . LEFT INGUINAL HERNIA REPAIR W/ MESH  06-10-2005  . TONSILLECTOMY       Family History  Problem Relation Age of Onset  . Cancer Father     "in lymph nodes"  . CAD Neg Hx      Social History   Social History  . Marital status: Married    Spouse name: N/A  . Number of children: N/A  . Years of education: N/A   Occupational History  . Not on file.   Social History Main Topics  . Smoking status: Never Smoker  . Smokeless tobacco: Never Used  . Alcohol use Yes     Comment: Drinks 2 mixed drinks per day, more during social events.  . Drug use: No  . Sexual activity: Not on file   Other Topics Concern  . Not on file   Social History Narrative  . No narrative on file     BP 114/80 (BP Location: Left Arm, Patient Position: Sitting, Cuff  Size: Large)   Pulse 72   Ht 6\' 2"  (1.88 m)   Wt 293 lb 6 oz (133.1 kg)   BMI 37.67 kg/m   Physical Exam:  Well appearing NAD HEENT: Unremarkable Neck:  6 cm JVD, no thyromegally Lymphatics:  No adenopathy Back:  No CVA tenderness Lungs:  Clear with no wheezes HEART:  IRegular rate rhythm, no murmurs, no rubs, no clicks Abd:  soft, positive bowel sounds, no organomegally, no rebound, no guarding Ext:  2 plus pulses, no edema, no cyanosis, no clubbing Skin:  No rashes no nodules Neuro:  CN II through XII intact, motor grossly intact  EKG - nsr with PVC's    Assess/Plan: 1. PVC's - He is completely asymptomatic. I discussed the issues with the patient. His EF will need to be followed. Because he has multiple PVC morphologies he is not a good candidate  for catheter ablation. I have recommended a period of watchful waiting. We discussed AA drug therapy for suppression but with no symptoms and no data on PVC suppression to offer a survival benefit, then  no indication for treatment/suppression at this time. I would suggest yearly echos to make sure he does not develop a PVC cardiomyopathy. 2. Minimal LV dysfunction - he will continue his beta blocker and ARB. 3. PE - he will likely be on lifelong Xarelto  Ross [name], M.D.

## 2016-06-14 ENCOUNTER — Other Ambulatory Visit: Payer: Self-pay | Admitting: *Deleted

## 2016-06-14 DIAGNOSIS — I5022 Chronic systolic (congestive) heart failure: Secondary | ICD-10-CM

## 2016-06-14 MED ORDER — LOSARTAN POTASSIUM 50 MG PO TABS: 50.0000 mg | ORAL_TABLET | Freq: Every day | ORAL | 2 refills | Status: DC

## 2016-06-20 ENCOUNTER — Ambulatory Visit (INDEPENDENT_AMBULATORY_CARE_PROVIDER_SITE_OTHER): Payer: Commercial Managed Care - PPO | Admitting: Cardiovascular Disease

## 2016-06-20 ENCOUNTER — Encounter: Payer: Self-pay | Admitting: Cardiovascular Disease

## 2016-06-20 VITALS — BP 96/70 | HR 77 | Ht 74.0 in | Wt 296.1 lb

## 2016-06-20 DIAGNOSIS — I493 Ventricular premature depolarization: Secondary | ICD-10-CM

## 2016-06-20 DIAGNOSIS — I2690 Septic pulmonary embolism without acute cor pulmonale: Secondary | ICD-10-CM | POA: Diagnosis not present

## 2016-06-20 DIAGNOSIS — I5022 Chronic systolic (congestive) heart failure: Secondary | ICD-10-CM

## 2016-06-20 NOTE — Patient Instructions (Addendum)
Medication Instructions:    Your physician recommends that you continue on your current medications as directed. Please refer to the Current Medication list given to you today.  --- If you need a refill on your cardiac medications before your next appointment, please call your pharmacy. ---  Labwork:  None ordered  Testing/Procedures:  None ordered  Follow-Up:  Your physician wants you to follow-up in: 6 months with Dr. Nahser.  You will receive a reminder letter in the mail two months in advance. If you don't receive a letter, please call our office to schedule the follow-up appointment.  Thank you for choosing [facility]!!            

## 2016-06-20 NOTE — Progress Notes (Signed)
Cardiology Office Note   Date:  06/20/2016   ID:  Ross [name], DOB 11-18-1955, MRN [id number]  PCP:  Melinda Crutch, MD  Cardiologist:   Mertie Moores, MD   Chief Complaint  Patient presents with  . Follow-up    chronic systolic CHF   Problem List 1. Recurrent Pulmonary Emboli 2. Chronic systolic CHF 3. PVCs     Nov. 20, 2016:    Ross [name] is a 61 y.o. male who presents for follow up of his recent hospitalization for recurrent pulmonary emboli .   Was found to have mildly decreased LV systolic function .   Was discharged with Lovenox for 1 month.  Now is on Xarelto 15 mg BID and is scheduled to change to Xarelto 20 mg a day following 21 days of the 15 BID dosing .  Marland Kitchen  This recommendation was given by hematology .   Has seen by Dr. Melvyn Novas.    Has done well since his DC.  No CP .  Breathing was ok  Feb. 27, 2017  Ross [name] is seen for follow up for his CHF and pulmonary emboli.   Was seen with his wife , [name].    They have a house up in [place], [place].   He went to [place].  [place] go [place] the mountains frequently.  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  His Breathing is good. Walks and does yard work occasionally .  No Cp , no dyspnea  Oct 27, 2015  Ross [name] is seen today for follow up of his chronic systolic CHF - EF [id number].  Also has hx of pulmonary embolus. He and [name] are moving to [place]. Able to do lots of work without issues.   No CP or dyspnea   Oct. 23, 2017:  Ross [name] is seen today for follow up of his CHF and pulmonary embolus  Still splitting time between here and [place] BP and HR are well controlled.   Getting some exercise - walking 3-5 miles a day .   Walks the roads  Has frequent PVCs on ECG He cannot feel them   06/20/2016: Ross [name] is seen for follow-up of his congestive heart failure. He has frequent premature ventricular contractions. A Holter monitor revealed significant ventricular  ectopy.   He had a PVC burden of 22%.    He was seen by electrophysiology. Because of his multiple PVC morphologies he was not a good candidate for catheter ablation. Dr. Lovena Le recommended a period of watchful waiting. They discussed antiarrhythmic therapy but concluded that there was no absolute indication for treatment/suppression at this time. Dr. Lovena Le recommended yearly echoes.  Getting a little exercise.   Is moving to a condo. No CP or dyspnea.      Past Medical History:  Diagnosis Date  . Acute meniscal tear of knee LEFT  . Cardiomyopathy ([place])    a. Dx 02/2015: EF 40-45% with continued RV dysfunction as well.  . Chronic systolic CHF (congestive heart failure) ([place]) 04/20/2015  . 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 CLL/SLL followed at [facility].  . DVT of leg (deep venous thrombosis) ([place])   . Habitual alcohol use   . PE (pulmonary embolism)    a. Multiple bilateral PEs on CT 07/2014. Initially tx with Lovenox/Xarelto x 6 months, but readmitted 02/2015 with recurrent acute on chronic PE.  . PE (pulmonary embolism) 03/17/2015  . PVC's (premature ventricular contractions)  a. Frequent, noted on telemetry during 02/2015 admisson - also seen on EKG in 07/2014.    Past Surgical History:  Procedure Laterality Date  . FATTY CYST REMOVED FROM FLANK AND BACK     FLANK- 15 YRS AGO/  BACK 12 YRS AGO  . KNEE ARTHROSCOPY  07/22/2011   Procedure: ARTHROSCOPY KNEE;  Surgeon: Magnus Sinning, MD;  Location: [facility];  Service: Orthopedics;  Laterality: Left;  Partial medial menisectomy and shaving of the patella  . LEFT INGUINAL HERNIA REPAIR W/ MESH  06-10-2005  . TONSILLECTOMY       Current Outpatient Prescriptions  Medication Sig Dispense Refill  . Investigational - Study Medication Additional Study Details: Placebo vs Fish oil with Vit D- 2 capsules daily    . losartan (COZAAR) 50 MG tablet Take 1 tablet (50 mg total) by mouth daily. 90 tablet 2    . metoprolol succinate (TOPROL XL) 100 MG 24 hr tablet Take 1 tablet (100 mg total) by mouth daily. Take with or immediately following a meal. 90 tablet 3  . XARELTO 20 MG TABS tablet TAKE 1 TABLET (20 MG TOTAL) BY MOUTH DAILY WITH SUPPER. 30 tablet 5   No current facility-administered medications for this visit.     Allergies:   Patient has no known allergies.    Social History:  The patient  reports that he has never smoked. He has never used smokeless tobacco. He reports that he drinks alcohol. He reports that he does not use drugs.   Family History:  The patient's family history includes Cancer in his father.    ROS:  Please see the history of present illness.    Review of Systems: Constitutional:  denies fever, chills, diaphoresis, appetite change and fatigue.  HEENT: denies photophobia, eye pain, redness, hearing loss, ear pain, congestion, sore throat, rhinorrhea, sneezing, neck pain, neck stiffness and tinnitus.  Respiratory: denies SOB, DOE, cough, chest tightness, and wheezing.  Cardiovascular: denies chest pain, palpitations and leg swelling.  Gastrointestinal: denies nausea, vomiting, abdominal pain, diarrhea, constipation, blood in stool.  Genitourinary: denies dysuria, urgency, frequency, hematuria, flank pain and difficulty urinating.  Musculoskeletal: denies  myalgias, back pain, joint swelling, arthralgias and gait problem.   Skin: denies pallor, rash and wound.  Neurological: denies dizziness, seizures, syncope, weakness, light-headedness, numbness and headaches.   Hematological: denies adenopathy, easy bruising, personal or family bleeding history.  Psychiatric/ Behavioral: denies suicidal ideation, mood changes, confusion, nervousness, sleep disturbance and agitation.       All other systems are reviewed and negative.    PHYSICAL EXAM: VS:  BP 96/70 (BP Location: Right Arm, Patient Position: Sitting, Cuff Size: Large)   Pulse 77   Ht 6\' 2"  (1.88 m)   Wt  296 lb 1.9 oz (134.3 kg)   SpO2 92%   BMI 38.02 kg/m  , BMI Body mass index is 38.02 kg/m. GEN: Well nourished, well developed, in no acute distress  HEENT: normal  Neck: no JVD, carotid bruits, or masses Cardiac: RRR with frequent PVCs ; no murmurs, rubs, or gallops,no edema  Respiratory:  clear to auscultation bilaterally, normal work of breathing GI: soft, nontender, nondistended, + BS MS: no deformity or atrophy  Skin: warm and dry, no rash Neuro:  Strength and sensation are intact Psych: normal   EKG:  EKG is not ordered today.      Recent Labs: 03/21/2016: BUN 20; Creat 0.88; Potassium 4.6; Sodium 141; TSH 2.33    Lipid Panel  Component Value Date/Time   CHOL 157 03/18/2015 0330   TRIG 104 03/18/2015 0330   HDL 33 (L) 03/18/2015 0330   CHOLHDL 4.8 03/18/2015 0330   VLDL 21 03/18/2015 0330   LDLCALC 103 (H) 03/18/2015 0330      Wt Readings from Last 3 Encounters:  06/20/16 296 lb 1.9 oz (134.3 kg)  05/10/16 293 lb 6 oz (133.1 kg)  03/21/16 294 lb (133.4 kg)      Other studies Reviewed: Additional studies/ records that were reviewed today include: . Review of the above records demonstrates:    ASSESSMENT AND PLAN:  1.  Mild chronic systolic congestive heart failure: Srijan presents with an ejection fraction of 40-45%. This was noted on echocardiogram.   We considered adding Aldactone at this visit but his blood pressure is fairly low. He remains asymptomatic. Continue current medications.    We'll continue the same medications.   2. Recurrent pulmonary emboli. Continue  Xarelto dose  20 mg a day   3. Frequent premature ventricular contractions: His Holter monitor revealed that he had a 22% PVC burden. He did not have any PVCs when I examined him today. He has been seen by Dr. Lovena Le and was thought to be not a good candidate for PVC ablation because of the multiple foci of the PVC. We'll continue to follow him and will get yearly echoes.  I'll see him in  6 months for follow-up visit.  Current medicines are reviewed at length with the patient today.  The patient does not have concerns regarding medicines.  The following changes have been made:  no change  Labs/ tests ordered today include:  No orders of the defined types were placed in this encounter.   Mertie Moores, MD  06/20/2016 8:52 AM    [address], [place], [place]  [address] Phone: [phone]; Fax: [phone]

## 2016-06-22 IMAGING — NM NM MYOCAR MULTI W/ SPECT
3 series · 18 of 18 positions shown · non-contrast
Comparison: none

[Series 1: stress_(id)_sa · 6.5mm · 6.51mm/px · 6 of 512 frames shown (1 of 2)]
[frame 43/512]
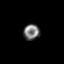
[frame 128/512]
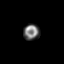
[frame 214/512]
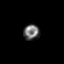
[frame 299/512]
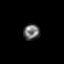
[frame 384/512]
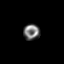
[frame 470/512]
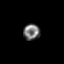

[Series 1: rest_(id)_sa · 6.5mm · 6.51mm/px · 6 of 64 frames shown]
[frame 6/64]
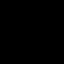
[frame 16/64]
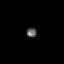
[frame 27/64]
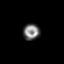
[frame 38/64]
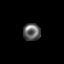
[frame 48/64]
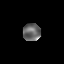
[frame 59/64]
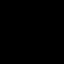

[Series 1: stress_(id)_sa · 6.5mm · 6.51mm/px · 6 of 64 frames shown (2 of 2)]
[frame 6/64]
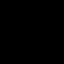
[frame 16/64]
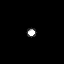
[frame 27/64]
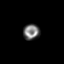
[frame 38/64]
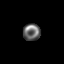
[frame 48/64]
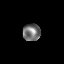
[frame 59/64]
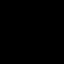

[18 of 18 positions shown; findings below may reference images not displayed]

Canned report from images found in remote index.

Refer to host system for actual result text.

## 2017-02-06 ENCOUNTER — Ambulatory Visit (INDEPENDENT_AMBULATORY_CARE_PROVIDER_SITE_OTHER): Payer: Commercial Managed Care - PPO | Admitting: Cardiovascular Disease

## 2017-02-06 ENCOUNTER — Encounter: Payer: Self-pay | Admitting: Cardiovascular Disease

## 2017-02-06 VITALS — BP 106/82 | HR 76 | Ht 74.0 in | Wt 290.1 lb

## 2017-02-06 DIAGNOSIS — I493 Ventricular premature depolarization: Secondary | ICD-10-CM | POA: Diagnosis not present

## 2017-02-06 DIAGNOSIS — I5022 Chronic systolic (congestive) heart failure: Secondary | ICD-10-CM | POA: Diagnosis not present

## 2017-02-06 NOTE — Patient Instructions (Signed)

## 2017-02-06 NOTE — Progress Notes (Signed)
Cardiology Office Note   Date:  02/06/2017   ID:  Ross [name], DOB 08/17/1955, MRN [id number]  PCP:  Lawerance Cruel, MD  Cardiologist:   Mertie Moores, MD   Chief Complaint  Patient presents with  . Follow-up    chf   Problem List 1. Recurrent Pulmonary Emboli 2. Chronic systolic CHF 3. PVCs   4.  Chronic lymphocytic lymphoma   Nov. 20, 2016:    Ross [name] is a 61 y.o. male who presents for follow up of his recent hospitalization for recurrent pulmonary emboli .   Was found to have mildly decreased LV systolic function .   Was discharged with Lovenox for 1 month.  Now is on Xarelto 15 mg BID and is scheduled to change to Xarelto 20 mg a day following 21 days of the 15 BID dosing .  Marland Kitchen  This recommendation was given by hematology .   Has seen by Dr. Melvyn Novas.    Has done well since his DC.  No CP .  Breathing was ok  Feb. 27, 2017  Ross [name] is seen for follow up for his CHF and pulmonary emboli.   Was seen with his wife , [name].    They have a house up in [place], [place].   He went to [place].  [place] go [place] the mountains frequently.  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  His Breathing is good. Walks and does yard work occasionally .  No Cp , no dyspnea  Oct 27, 2015  [name] is seen today for follow up of his chronic systolic CHF - EF 38%.  Also has hx of pulmonary embolus. He and [name] are moving to [place]. Able to do lots of work without issues.   No CP or dyspnea   Oct. 23, 2017:  [name] is seen today for follow up of his CHF and pulmonary embolus  Still splitting time between here and [place] BP and HR are well controlled.   Getting some exercise - walking 3-5 miles a day .   Walks the roads  Has frequent PVCs on ECG He cannot feel them   06/20/2016: Ross [name] is seen for follow-up of his congestive heart failure. He has frequent premature ventricular contractions. A Holter monitor revealed  significant ventricular ectopy.   He had a PVC burden of 22%.    He was seen by electrophysiology. Because of his multiple PVC morphologies he was not a good candidate for catheter ablation. Dr. Lovena Le recommended a period of watchful waiting. They discussed antiarrhythmic therapy but concluded that there was no absolute indication for treatment/suppression at this time. Dr. Lovena Le recommended yearly echoes.  Getting a little exercise.   Is moving to a condo. No CP or dyspnea.     Sept. 10, 2018:  Ross [name] is doing well Has been having night sweats,   Has been started on Ibrutinib.  Stable from a cardiac standpoint. Walks 3-4 miles a day  Walks up in [place] on campus . Just started golfing   Past Medical History:  Diagnosis Date  . Acute meniscal tear of knee LEFT  . Cardiomyopathy ([place])    a. Dx 02/2015: EF 40-45% with continued RV dysfunction as well.  . Chronic systolic CHF (congestive heart failure) ([place]) 04/20/2015  . CLL (chronic lymphocytic leukemia) ([place]) 03/16/2015   a. CLL/SLL followed at [facility].  . DVT of leg (deep venous thrombosis) ([place])   . Habitual  alcohol use   . PE (pulmonary embolism)    a. Multiple bilateral PEs on CT 07/2014. Initially tx with Lovenox/Xarelto x 6 months, but readmitted 02/2015 with recurrent acute on chronic PE.  . PE (pulmonary embolism) 03/17/2015  . PVC's (premature ventricular contractions)    a. Frequent, noted on telemetry during 02/2015 admisson - also seen on EKG in 07/2014.    Past Surgical History:  Procedure Laterality Date  . FATTY CYST REMOVED FROM FLANK AND BACK     FLANK- 15 YRS AGO/  BACK 12 YRS AGO  . KNEE ARTHROSCOPY  07/22/2011   Procedure: ARTHROSCOPY KNEE;  Surgeon: Magnus Sinning, MD;  Location: [facility];  Service: Orthopedics;  Laterality: Left;  Partial medial menisectomy and shaving of the patella  . LEFT INGUINAL HERNIA REPAIR W/ MESH  06-10-2005  . TONSILLECTOMY       Current Outpatient  Prescriptions  Medication Sig Dispense Refill  . ibrutinib (IMBRUVICA) 140 MG capsul Take 3 capsules by mouth daily. Start 02/13/17    . Ibrutinib 70 MG CAPS Take 70 mg by mouth every morning. Will start 02/13/17    . losartan (COZAAR) 50 MG tablet Take 1 tablet (50 mg total) by mouth daily. 90 tablet 2  . metoprolol succinate (TOPROL XL) 100 MG 24 hr tablet Take 1 tablet (100 mg total) by mouth daily. Take with or immediately following a meal. 90 tablet 3  . XARELTO 20 MG TABS tablet TAKE 1 TABLET (20 MG TOTAL) BY MOUTH DAILY WITH SUPPER. 30 tablet 5   No current facility-administered medications for this visit.     Allergies:   Patient has no known allergies.    Social History:  The patient  reports that he has never smoked. He has never used smokeless tobacco. He reports that he drinks alcohol. He reports that he does not use drugs.   Family History:  The patient's family history includes Cancer in his father.    ROS:  Please see the history of present illness.    Review of Systems: Constitutional:  denies fever, chills, diaphoresis, appetite change and fatigue.  HEENT: denies photophobia, eye pain, redness, hearing loss, ear pain, congestion, sore throat, rhinorrhea, sneezing, neck pain, neck stiffness and tinnitus.  Respiratory: denies SOB, DOE, cough, chest tightness, and wheezing.  Cardiovascular: denies chest pain, palpitations and leg swelling.  Gastrointestinal: denies nausea, vomiting, abdominal pain, diarrhea, constipation, blood in stool.  Genitourinary: denies dysuria, urgency, frequency, hematuria, flank pain and difficulty urinating.  Musculoskeletal: denies  myalgias, back pain, joint swelling, arthralgias and gait problem.   Skin: denies pallor, rash and wound.  Neurological: denies dizziness, seizures, syncope, weakness, light-headedness, numbness and headaches.   Hematological: denies adenopathy, easy bruising, personal or family bleeding history.   Psychiatric/ Behavioral: denies suicidal ideation, mood changes, confusion, nervousness, sleep disturbance and agitation.       All other systems are reviewed and negative.    PHYSICAL EXAM: VS:  BP 106/82   Pulse 76   Ht 6\' 2"  (1.88 m)   Wt 290 lb 1.9 oz (131.6 kg)   BMI 37.25 kg/m  , BMI Body mass index is 37.25 kg/m. GEN: Well nourished, well developed, in no acute distress  HEENT: normal  Neck: no JVD, carotid bruits, or masses Cardiac: RRR with frequent PVCs ; no murmurs, rubs, or gallops,no edema  Respiratory:  clear to auscultation bilaterally, normal work of breathing GI: soft, nontender, nondistended, + BS MS: no deformity or atrophy  Skin: warm and dry, no rash Neuro:  Strength and sensation are intact Psych: normal   EKG:  EKG is ordered today.    Sept. 10, 2018:   NSR at 83 with 1st degree AV block .  Occasional PVCs   Recent Labs: 03/21/2016: BUN 20; Creat 0.88; Potassium 4.6; Sodium 141; TSH 2.33    Lipid Panel    Component Value Date/Time   CHOL 157 03/18/2015 0330   TRIG 104 03/18/2015 0330   HDL 33 (L) 03/18/2015 0330   CHOLHDL 4.8 03/18/2015 0330   VLDL 21 03/18/2015 0330   LDLCALC 103 (H) 03/18/2015 0330      Wt Readings from Last 3 Encounters:  02/06/17 290 lb 1.9 oz (131.6 kg)  06/20/16 296 lb 1.9 oz (134.3 kg)  05/10/16 293 lb 6 oz (133.1 kg)      Other studies Reviewed: Additional studies/ records that were reviewed today include: . Review of the above records demonstrates:    ASSESSMENT AND PLAN:  1.  Mild chronic systolic congestive heart failure: Maylon presents with an ejection fraction of 40-45%.  EF has normal normalized. Continue meds.   We'll continue the same medications.   2. Recurrent pulmonary emboli. Continue  Xarelto dose  20 mg a day   3. Frequent premature ventricular contractions: His Holter monitor revealed that he had a 22% PVC burden. He did not have any PVCs when I examined him today. He has been seen by  Dr. Lovena Le and was thought to be not a good candidate for PVC ablation because of the multiple foci of the PVC. We'll continue to follow him and will get yearly echoes. Continue toprol   I'll see him in 6 months for follow-up visit.  Current medicines are reviewed at length with the patient today.  The patient does not have concerns regarding medicines.  The following changes have been made:  no change  Labs/ tests ordered today include:  No orders of the defined types were placed in this encounter.   Mertie Moores, MD  02/06/2017 4:09 PM    [facility] [address], [place], [place]  [address] Phone: [phone]; Fax: [phone]

## 2017-03-22 ENCOUNTER — Other Ambulatory Visit: Payer: Self-pay | Admitting: Cardiovascular Disease

## 2017-04-07 ENCOUNTER — Other Ambulatory Visit: Payer: Self-pay | Admitting: *Deleted

## 2017-04-07 DIAGNOSIS — I5022 Chronic systolic (congestive) heart failure: Secondary | ICD-10-CM

## 2017-04-07 MED ORDER — LOSARTAN POTASSIUM 50 MG PO TABS: 50.0000 mg | ORAL_TABLET | Freq: Every day | ORAL | 2 refills | Status: DC

## 2017-09-06 ENCOUNTER — Encounter: Payer: Self-pay | Admitting: Cardiovascular Disease

## 2017-09-11 ENCOUNTER — Encounter: Payer: Self-pay | Admitting: Cardiovascular Disease

## 2017-09-11 ENCOUNTER — Ambulatory Visit: Payer: BLUE CROSS/BLUE SHIELD | Admitting: Cardiovascular Disease

## 2017-09-11 VITALS — BP 106/66 | HR 83 | Ht 74.0 in | Wt 303.0 lb

## 2017-09-11 DIAGNOSIS — I493 Ventricular premature depolarization: Secondary | ICD-10-CM | POA: Diagnosis not present

## 2017-09-11 DIAGNOSIS — I5022 Chronic systolic (congestive) heart failure: Secondary | ICD-10-CM | POA: Diagnosis not present

## 2017-09-11 NOTE — Patient Instructions (Signed)
Medication Instructions:  Your physician recommends that you continue on your current medications as directed. Please refer to the Current Medication list given to you today.   Labwork: None Ordered   Testing/Procedures: Your physician has requested that you have an echocardiogram in 6 months. Echocardiography is a painless test that uses sound waves to create images of your heart. It provides your doctor with information about the size and shape of your heart and how well your heart's chambers and valves are working. This procedure takes approximately one hour. There are no restrictions for this procedure.   Follow-Up: Your physician wants you to follow-up in: 6 months with a PA or Nurse Practitioner on Dr. Elmarie Shiley team. You will receive a reminder letter in the mail two months in advance. If you don't receive a letter, please call our office to schedule the follow-up appointment.   If you need a refill on your cardiac medications before your next appointment, please call your pharmacy.   Thank you for choosing [facility]! Christen Bame, RN [phone]

## 2017-09-11 NOTE — Progress Notes (Signed)
Cardiology Office Note   Date:  09/11/2017   ID:  Ross [name], DOB Jun 10, 1955, MRN [id number]  PCP:  Ross Cruel, MD  Cardiologist:   Ross Moores, MD   Chief Complaint  Patient presents with  . Congestive Heart Failure   Problem List 1. Recurrent Pulmonary Emboli 2. Chronic systolic CHF 3. PVCs   4.  Chronic lymphocytic lymphoma   Nov. 20, 2016:    Ross [name] is a 62 y.o. male who presents for follow up of his recent hospitalization for recurrent pulmonary emboli .   Was found to have mildly decreased LV systolic function .   Was discharged with Lovenox for 1 month.  Now is on Xarelto 15 mg BID and is scheduled to change to Xarelto 20 mg a day following 21 days of the 15 BID dosing .  Marland Kitchen  This recommendation was given by hematology .   Has seen by Ross [name].    Has done well since his DC.  No CP .  Breathing was ok  Feb. 27, 2017  Ross [name] is seen for follow up for his CHF and pulmonary emboli.   Was seen with his wife , Ross [name].    They have a house up in [place], [place].   He went to [place].  [place]  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  His Breathing is good. Walks and does yard work occasionally .  No Cp , no dyspnea  Oct 27, 2015  Ross [name] is seen today for follow up of his chronic systolic CHF - EF 36%.  Also has hx of pulmonary embolus. He and Ross [name] are moving to [place]. Able to do lots of work without issues.   No CP or dyspnea   Oct. 23, 2017:  Ross [name] is seen today for follow up of his CHF and pulmonary embolus  Still splitting time between here and [place] BP and HR are well controlled.   Getting some exercise - walking 3-5 miles a day .   Walks the roads  Has frequent PVCs on ECG He cannot feel them   06/20/2016: Ross [name] is seen for follow-up of his congestive heart failure. He has frequent premature ventricular contractions. A Holter monitor  revealed significant ventricular ectopy.   He had a PVC burden of 22%.    He was seen by electrophysiology. Because of his multiple PVC morphologies he was not a good candidate for catheter ablation. Ross [name] recommended a period of watchful waiting. They discussed antiarrhythmic therapy but concluded that there was no absolute indication for treatment/suppression at this time. Ross [name] recommended yearly echoes.  Getting a little exercise.   Is moving to a condo. No CP or dyspnea.     Sept. 10, 2018:  Ross [name] is doing well Has been having night sweats,   Has been started on Ibrutinib.  Stable from a cardiac standpoint. Walks 3-4 miles a day  Walks up in [place] on campus . Just started golfing   September 11, 2017: Doing well from a cardiac standpoint.   Is on Ibrutinib 140 mg a day ( caused him to bleed easily  With this along with the Xarelto )  Needs to have Mohs surgery on a basal cell cencer  Breathing is going well. Walks 2-3 miles a day .    Past Medical History:  Diagnosis Date  . Acute meniscal tear of knee LEFT  .  Cardiomyopathy ([place])    a. Dx 02/2015: EF 40-45% with continued RV dysfunction as well.  . Chronic systolic CHF (congestive heart failure) ([place]) 04/20/2015  . CLL (chronic lymphocytic leukemia) ([place]) 03/16/2015   a. CLL/SLL followed at [name].  . DVT of leg (deep venous thrombosis) ([place])   . Habitual alcohol use   . PE (pulmonary embolism)    a. Multiple bilateral PEs on CT 07/2014. Initially tx with Lovenox/Xarelto x 6 months, but readmitted 02/2015 with recurrent acute on chronic PE.  . PE (pulmonary embolism) 03/17/2015  . PVC's (premature ventricular contractions)    a. Frequent, noted on telemetry during 02/2015 admisson - also seen on EKG in 07/2014.    Past Surgical History:  Procedure Laterality Date  . FATTY CYST REMOVED FROM FLANK AND BACK     FLANK- 15 YRS AGO/  BACK 12 YRS AGO  . KNEE ARTHROSCOPY  07/22/2011   Procedure: ARTHROSCOPY KNEE;   Surgeon: Ross Sinning, MD;  Location: [facility];  Service: Orthopedics;  Laterality: Left;  Partial medial menisectomy and shaving of the patella  . LEFT INGUINAL HERNIA REPAIR W/ MESH  06-10-2005  . TONSILLECTOMY       Current Outpatient Medications  Medication Sig Dispense Refill  . ibrutinib (IMBRUVICA) 140 MG capsul Take 3 capsules by mouth daily. Start 02/13/17    . losartan (COZAAR) 50 MG tablet Take 1 tablet (50 mg total) daily by mouth. 90 tablet 2  . metoprolol succinate (TOPROL-XL) 50 MG 24 hr tablet Take 50 mg by mouth daily. Take with or immediately following a meal.    . XARELTO 20 MG TABS tablet TAKE 1 TABLET (20 MG TOTAL) BY MOUTH DAILY WITH SUPPER. 30 tablet 5   No current facility-administered medications for this visit.     Allergies:   Patient has no known allergies.    Social History:  The patient  reports that he has never smoked. He has never used smokeless tobacco. He reports that he drinks alcohol. He reports that he does not use drugs.   Family History:  The patient's family history includes Cancer in his father.    ROS:   Noted in current history, otherwise review of systems is negative.  Physical Exam: Blood pressure 106/66, pulse 83, height 6\' 2"  (1.88 m), weight (!) 303 lb (137.4 kg), SpO2 94 %.  GEN:  Well nourished, well developed in no acute distress HEENT: Normal NECK: No JVD; No carotid bruits LYMPHATICS: No lymphadenopathy CARDIAC: RR, with frequent premature beats  RESPIRATORY:  Clear to auscultation without rales, wheezing or rhonchi  ABDOMEN: Soft, non-tender, non-distended MUSCULOSKELETAL:  No edema; No deformity  SKIN: Warm and dry NEUROLOGIC:  Alert and oriented x 3   EKG:    Recent Labs: No results found for requested labs within last 8760 hours.    Lipid Panel    Component Value Date/Time   CHOL 157 03/18/2015 0330   TRIG 104 03/18/2015 0330   HDL 33 (L) 03/18/2015 0330   CHOLHDL 4.8 03/18/2015 0330     VLDL 21 03/18/2015 0330   LDLCALC 103 (H) 03/18/2015 0330      Wt Readings from Last 3 Encounters:  09/11/17 (!) 303 lb (137.4 kg)  02/06/17 290 lb 1.9 oz (131.6 kg)  06/20/16 296 lb 1.9 oz (134.3 kg)      Other studies Reviewed: Additional studies/ records that were reviewed today include: . Review of the above records demonstrates:    ASSESSMENT AND PLAN:  1.  Mild chronic systolic congestive heart failure:   We will repeat echocardiogram next year. Will have him see APP in 6 months and will see me in 1 year.    We'll continue the same medications.   2. Recurrent pulmonary emboli.  He needs to have Mohs surgery for basal cell cancer.  He will need to hold his Xarelto for 2 days prior to the procedure.  Restarting the Xarelto will be up to the surgeon but I suggest that he should be able to start back 2-3 days after the procedure.  3. Frequent premature ventricular contractions:    Has 22% PVC burden.  Was not a candidate for ablation.  Will repeat echo in 1 year .  Consider repeat holter.   I'll see him in 6 months for follow-up visit.  Current medicines are reviewed at length with the patient today.  The patient does not have concerns regarding medicines.  The following changes have been made:  no change  Labs/ tests ordered today include:  No orders of the defined types were placed in this encounter.   Ross Moores, MD  09/11/2017 10:40 AM    [facility] [place], [place], [place]  [address] Phone: [phone]; Fax: [phone]

## 2018-01-01 ENCOUNTER — Other Ambulatory Visit: Payer: Self-pay | Admitting: Cardiovascular Disease

## 2018-01-01 DIAGNOSIS — I5022 Chronic systolic (congestive) heart failure: Secondary | ICD-10-CM

## 2018-03-09 ENCOUNTER — Encounter: Payer: Self-pay | Admitting: Physician Assistant

## 2018-03-14 ENCOUNTER — Other Ambulatory Visit: Payer: Self-pay

## 2018-03-14 ENCOUNTER — Other Ambulatory Visit: Payer: Self-pay | Admitting: Cardiovascular Disease

## 2018-03-14 MED ORDER — METOPROLOL SUCCINATE ER 50 MG PO TB24: 50.0000 mg | ORAL_TABLET | Freq: Every day | ORAL | 1 refills | Status: DC

## 2018-03-19 ENCOUNTER — Ambulatory Visit (HOSPITAL_COMMUNITY): Payer: BLUE CROSS/BLUE SHIELD

## 2018-03-19 ENCOUNTER — Encounter (HOSPITAL_COMMUNITY): Payer: Self-pay

## 2018-03-21 ENCOUNTER — Encounter (HOSPITAL_COMMUNITY): Payer: Self-pay

## 2018-03-21 NOTE — Progress Notes (Signed)
[name] presented for an echocardiogram on 03/19/2018. He mentioned he has had an echo recently at another medical institution. Upon checking on Care Everywhere, he did have an echo performed at [facility] on 11/14/17. I spoke with Dr. Acie Fredrickson and he said that we can cancel this echo visit but keep the appointment with Ross [name].   Wyatt Mage, RDCS

## 2018-03-27 ENCOUNTER — Encounter: Payer: Self-pay | Admitting: Physician Assistant

## 2018-03-27 ENCOUNTER — Ambulatory Visit: Payer: BLUE CROSS/BLUE SHIELD | Admitting: Physician Assistant

## 2018-03-27 VITALS — BP 116/74 | HR 92 | Ht 73.0 in | Wt 308.8 lb

## 2018-03-27 DIAGNOSIS — I420 Dilated cardiomyopathy: Secondary | ICD-10-CM

## 2018-03-27 DIAGNOSIS — Z86711 Personal history of pulmonary embolism: Secondary | ICD-10-CM

## 2018-03-27 DIAGNOSIS — I340 Nonrheumatic mitral (valve) insufficiency: Secondary | ICD-10-CM

## 2018-03-27 DIAGNOSIS — I493 Ventricular premature depolarization: Secondary | ICD-10-CM

## 2018-03-27 DIAGNOSIS — C9110 Chronic lymphocytic leukemia of B-cell type not having achieved remission: Secondary | ICD-10-CM

## 2018-03-27 DIAGNOSIS — R0683 Snoring: Secondary | ICD-10-CM

## 2018-03-27 DIAGNOSIS — I4819 Other persistent atrial fibrillation: Secondary | ICD-10-CM

## 2018-03-27 NOTE — Patient Instructions (Addendum)
Medication Instructions:  1. Your physician recommends that you continue on your current medications as directed. Please refer to the Current Medication list given to you today.  If you need a refill on your cardiac medications before your next appointment, please call your pharmacy.   Lab work: NONE ORDERED TODAY If you have labs (blood work) drawn today and your tests are completely normal, you will receive your results only by: Marland Kitchen MyChart Message (if you have MyChart) OR . A paper copy in the mail If you have any lab test that is abnormal or we need to change your treatment, we will call you to review the results.  Testing/Procedures: 1. Your physician has recommended that you wear a 24 HOUR holter monitor. Holter monitors are medical devices that record the heart's electrical activity. Doctors most often use these monitors to diagnose arrhythmias. Arrhythmias are problems with the speed or rhythm of the heartbeat. The monitor is a small, portable device. You can wear one while you do your normal daily activities. This is usually used to diagnose what is causing palpitations/syncope (passing out).  2. Your physician has recommended that you have a sleep study. This test records several body functions during sleep, including: brain activity, eye movement, oxygen and carbon dioxide blood levels, heart rate and rhythm, breathing rate and rhythm, the flow of air through your mouth and nose, snoring, body muscle movements, and chest and belly movement.  Follow-Up: At [facility], you and your health needs are our priority.  As part of our continuing mission to provide you with exceptional heart care, we have created designated Provider Care Teams.  These Care Teams include your primary Cardiologist (physician) and Advanced Practice Providers (APPs -  Physician Assistants and Nurse Practitioners) who all work together to provide you with the care you need, when you need it. You will need a follow  up appointment in:  8 weeks.  Please call our office 2 months in advance to schedule this appointment.  You may see Mertie Moores, MD or one of the following Advanced Practice Providers on your designated Care Team: Richardson Dopp, PA-C [place], [place] . Daune Perch, NP  Any Other Special Instructions Will Be Listed Below (If Applicable).

## 2018-03-27 NOTE — Progress Notes (Signed)
Cardiology Office Note:    Date:  03/27/2018   ID:  Ross [name], DOB 1956/04/07, MRN [id number]  PCP:  Ross Cruel, MD  Cardiologist:  Ross Moores, MD  Electrophysiologist:  None  Oncologist: Dr. Leretha [name])  Referring MD: Ross Cruel, MD   Chief Complaint  Patient presents with  . Follow-up    Cardiomyopathy    History of Present Illness:    Ross [name] is a 62 y.o. male with recurrent pulmonary emboli, chronic lymphocytic leukemia, systolic heart failure, PVCs.  EF at that time was 40 to 45%.  Nuclear stress test demonstrated no ischemia.  Follow-up echo in 2017 demonstrated normal LV function.  Holter monitor in 2017 demonstrated frequent PVCs with 22% burden.  He was evaluated by EP (Dr. Lovena Le).  Given multiple PVC morphologies, he was not a candidate for ablation.  Antiarrhythmic therapy was not recommended.  He was last seen by Dr. Acie Fredrickson in April 2019.  He was admitted to [facility] in [place], [place] in June 2019 for influenza.  Echocardiogram at that time demonstrated normal LV function.    [name] returns for follow-up.  Since last seen, he has been doing well.  He did injure his knee and has had to cut back on his walking.  He has not had any dizziness, fatigue, shortness of breath, palpitations.  He has not had any chest pain, syncope.  He denies paroxysmal nocturnal dyspnea.  He does have a history of snoring.  He is leaving tomorrow to go to [place].  He plans to go to the [employer] [facility] [employer].  Prior CV studies:   The following studies were reviewed today:  Echo 11/14/2017 [facility]) Summary  1. Overall left ventricular ejection fraction is estimated at 60 to 65%.  2. Normal global left ventricular systolic function.  3. Severe concentric left ventricular hypertrophy.  4. Severely dilated left atrium.  5. Moderately dilated right atrium.  6. Mild mitral valve regurgitation.  7. Moderate mitral valve prolapse.  8. No intracardiac thrombi, mass or vegetations.  24-hour Holter 04/11/2016  NSR  Episodes of sinus brady  Occasional [place] heart block  Frequent PVCs (22% burden)  Several ventricular couplets and 1 3 beat run of nonsustained VT.  Echocardiogram 04/11/2016 Moderate LVH, EF 50-55 anteroseptal hypokinesis, grade 1 diastolic dysfunction, aortic root 40 mm, mildly dilated ascending aorta, mild mitral valve prolapse involving the anterior leaflet and posterior leaflet, trivial MR, moderate LAE, mild RAE, trivial TR  Nuclear stress test 04/28/2015 Low risk stress nuclear study with a small, severe, fixed apical defect consistent with apical thinning; no ischemia; EF 45 with global hypokinesis; moderate LVE.  Echo 03/17/2015 EF 40-45, diffuse HK, grade 1 diastolic dysfunction, mild MR, moderate LAE, moderately reduced RVSF, mild TR  Past Medical History:  Diagnosis Date  . Acute meniscal tear of knee LEFT  . Cardiomyopathy ([place])    a. Dx 10/16: EF 40-45 // Nuc stress 10/16: Low risk; small, severe, fixed apical defect c/w apical thinning; no ischemia; EF 45 with global hypokinesis; moderate LVE. // Echo 11/17: Mod LVH, EF 50-55 ant-sept HK, Gr 1 DD, aortic root 40 mm, mildly dilated asc aorta, mild MVP, trivial MR, mod LAE, mild RAE, trivial TR // Echo 6/19 [facility]):  EF 60-65, mild MR mod MVP   . Chronic systolic CHF (congestive heart failure) ([place]) 04/20/2015  . CLL (chronic lymphocytic leukemia) ([place]) 03/16/2015   a. CLL/SLL followed at [facility]  . DVT of leg (  deep venous thrombosis) ([place])   . Habitual alcohol use   . PE (pulmonary embolism)    a. Multiple bilateral PEs on CT 07/2014. Initially tx with Lovenox/Xarelto x 6 months, but readmitted 02/2015 with recurrent acute on chronic PE.- Lifelong anticoag   . PVC's (premature ventricular contractions)    a. Frequent, noted on telemetry during 02/2015 admisson - also seen on EKG in 07/2014. // Holter 2017 - 22% PVC burden - eval by  EP >> multiple morphologies; not a candidate for ablation // needs yearly echo to assess for LV dysfunction    Surgical Hx: The patient  has a past surgical history that includes LEFT INGUINAL HERNIA REPAIR W/ MESH (06-10-2005); FATTY CYST REMOVED FROM [facility] AND BACK; Knee arthroscopy (07/22/2011); and Tonsillectomy.   Current Medications: Current Meds  Medication Sig  . ibrutinib (IMBRUVICA) 140 MG capsul Take 3 capsules by mouth daily. Start 02/13/17  . losartan (COZAAR) 50 MG tablet TAKE 1 TABLET (50 MG TOTAL) DAILY BY MOUTH.  . metoprolol succinate (TOPROL-XL) 50 MG 24 hr tablet Take 1 tablet (50 mg total) by mouth daily. Take with or immediately following a meal.  . XARELTO 20 MG TABS tablet TAKE 1 TABLET (20 MG TOTAL) BY MOUTH DAILY WITH SUPPER.     Allergies:   Patient has no known allergies.   Social History   Tobacco Use  . Smoking status: Never Smoker  . Smokeless tobacco: Never Used  Substance Use Topics  . Alcohol use: Yes    Comment: Drinks 2 mixed drinks per day, more during social events.  . Drug use: No     Family Hx: The patient's family history includes Cancer in his father. There is no history of CAD.  ROS:   Please see the history of present illness.    ROS All other systems reviewed and are negative.   EKGs/Labs/Other Test Reviewed:    EKG:  EKG is  ordered today.  The ekg ordered today demonstrates atrial fibrillation, heart rate 92  Recent Labs: No results found for requested labs within last 8760 hours.   Recent Lipid Panel Lab Results  Component Value Date/Time   CHOL 157 03/18/2015 03:30 AM   TRIG 104 03/18/2015 03:30 AM   HDL 33 (L) 03/18/2015 03:30 AM   CHOLHDL 4.8 03/18/2015 03:30 AM   LDLCALC 103 (H) 03/18/2015 03:30 AM    Physical Exam:    VS:  BP 116/74   Pulse 92   Ht 6\' 1"  (1.854 m)   Wt (!) 308 lb 12.8 oz (140.1 kg)   BMI 40.74 kg/m     Wt Readings from Last 3 Encounters:  03/27/18 (!) 308 lb 12.8 oz (140.1 kg)  09/11/17  (!) 303 lb (137.4 kg)  02/06/17 290 lb 1.9 oz (131.6 kg)     Physical Exam  Constitutional: He is oriented to person, place, and time. He appears well-developed and well-nourished. No distress.  HENT:  Head: Normocephalic and atraumatic.  Eyes: No scleral icterus.  Neck: No JVD present. No thyromegaly present.  Cardiovascular: Normal rate. An irregularly irregular rhythm present.  Murmur heard.  Systolic murmur is present with a grade of 2/6 at the lower left sternal border. Pulmonary/Chest: Effort normal. He has no rales.  Abdominal: Soft.  Musculoskeletal: He exhibits no edema.  Lymphadenopathy:    He has no cervical adenopathy.  Neurological: He is alert and oriented to person, place, and time.  Skin: Skin is warm and dry.  Psychiatric: He has a  normal mood and affect.    ASSESSMENT & PLAN:    Persistent atrial fibrillation He was not aware that he is in atrial fibrillation.  It seems that he is asymptomatic.  On ECG, his heart rate seems to be well controlled.  He is already on anticoagulation for recurrent pulmonary emboli.  We discussed the pros and cons of proceeding with cardioversion to restore normal sinus rhythm.  As he is asymptomatic, he prefers to hold off for now.  He does have a history of snoring as well as evidence of biatrial enlargement on prior echocardiogram.  -Continue anticoagulation with Rivaroxaban  -Arrange 24-hour Holter to assess heart rate control  -Recent creatinine, hemoglobin normal  -Follow-up in 6-8 weeks  Dilated cardiomyopathy (HCC) EF noted to be 40-45 during admission for recurrent pulmonary embolism in 2016.  Recent echocardiogram with normal LV function.  Continue beta-blocker, ARB.  PVC's (premature ventricular contractions) High PVC burden noted on previous Holter monitor.  He was not felt to be a candidate for ablation.  Recent echo with normal LV function.  CLL (chronic lymphocytic leukemia) ([place]) Continue follow-up with oncology at  [facility].  Ibrutinib can potentially cause atrial fibrillation.  However, he does have a history of snoring as well as a history of biatrial enlargement.  He probably has sleep apnea, and I suspect that this is the main cause of his atrial fibrillation.  History of recurrent pulmonary embolism He is on lifelong anticoagulation.  Snoring  STOP-BANG Score for Obstructive Sleep Apnea from [email or web address]  on 03/27/2018 6 points High risk of OSA INPUTS: Do you snore loudly? -> 1 = Yes Do you often feel tired, fatigued, or sleepy during the daytime? -> 0 = No Has anyone observed you stop breathing during sleep? -> 0 = No Do you have (or are you being treated for) high blood pressure? -> 1 = Yes BMI -> 1 = >35 kg/m Age -> 1 = >50 years Neck circumference -> 1 = >40 cm Gender -> 1 = Male  -Arrange split-night sleep study  Mitral regurgitation Mild by most recent echocardiogram.  He does have moderate mitral valve prolapse.   Dispo:  Return in about 8 weeks (around 05/22/2018) for Routine Follow Up, w/ Dr. Acie Fredrickson, or Richardson Dopp, PA-C.   Medication Adjustments/Labs and Tests Ordered: Current medicines are reviewed at length with the patient today.  Concerns regarding medicines are outlined above.  Tests Ordered: Orders Placed This Encounter  Procedures  . Holter monitor - 24 hour  . EKG 12-Lead  . Split night study   Medication Changes: No orders of the defined types were placed in this encounter.   Signed, Richardson Dopp, PA-C  03/27/2018 2:18 PM    [facility] [place], [place],   [address] Phone: [phone]; Fax: [phone]

## 2018-03-28 ENCOUNTER — Telehealth: Payer: Self-pay | Admitting: *Deleted

## 2018-03-28 NOTE — Telephone Encounter (Signed)
-----   Message from Michae Kava, [place] sent at 03/27/2018  2:19 PM EDT ----- Regarding: SPLIT NIGHT SLEEP STUDY Hi ladies,  Pt saw Brynda Rim. PA today and is needing to be set up for a Split Night Sleep Study, dx A-fib, Snoring.  Thank you Arbie Cookey

## 2018-03-28 NOTE — Telephone Encounter (Signed)
-----   Message from Michae Kava, [place] sent at 03/27/2018  2:19 PM EDT ----- Regarding: SPLIT NIGHT SLEEP STUDY Hi ladies,  Pt saw Brynda Rim. PA today and is needing to be set up for a Split Night Sleep Study, dx A-fib, Snoring.  Thank you Arbie Cookey

## 2018-03-28 NOTE — Telephone Encounter (Signed)
Staff message sent to both Gae Bon and [employer] Signs authorization received. Ok to schedule sleep study. Auth # [id number]. Valid dates 03/28/18 to 05/26/18.

## 2018-04-02 NOTE — Telephone Encounter (Signed)
Patient is scheduled for lab study on 05/17/18. Patient understands his sleep study will be done at [facility] sleep lab. Patient understands he will receive a sleep packet in a week or so. Patient understands to call if he does not receive the sleep packet in a timely manner.  Left detailed message on voicemail with date and time of study and informed patient to call back to confirm or reschedule.

## 2018-04-09 ENCOUNTER — Ambulatory Visit (INDEPENDENT_AMBULATORY_CARE_PROVIDER_SITE_OTHER): Payer: BLUE CROSS/BLUE SHIELD

## 2018-04-09 DIAGNOSIS — I4819 Other persistent atrial fibrillation: Secondary | ICD-10-CM

## 2018-04-16 ENCOUNTER — Telehealth: Payer: Self-pay | Admitting: Cardiovascular Disease

## 2018-04-16 NOTE — Telephone Encounter (Signed)
Note faxed to Dr. Milon Dikes at [phone] Office phone number is [phone]

## 2018-04-16 NOTE — Telephone Encounter (Signed)
.    Ross [name] is a 62 year old gentleman with a history of recurrent pulmonary emboli, chronic lymphocytic leukemia, history of systolic heart failure and premature ventricular contractions.  His echocardiogram several years ago revealed an ejection fraction of 40 to 45%.  More recent echo revealed normal left ventricular systolic function.  Not had any episodes of chest pain or shortness of breath.  He has been  on Xarelto 20 mg a day for recurrent pulmonary emboli.  He is at low risk for his upcoming arthroscopic knee surgery. Hold his Xarelto for 1 day prior to the knee surgery.  He should be restarted on Xarelto as soon as safe from a surgical standpoint. He will need to be started back on Xarelto 20 mg a day      Mertie Moores, MD  04/16/2018 [place],  [place], [place]  [address] Pager [phone] Phone: [phone]; Fax: [phone]

## 2018-05-14 ENCOUNTER — Ambulatory Visit: Payer: BLUE CROSS/BLUE SHIELD | Admitting: Cardiovascular Disease

## 2018-05-14 ENCOUNTER — Encounter: Payer: Self-pay | Admitting: Nurse Practitioner

## 2018-05-14 ENCOUNTER — Encounter: Payer: Self-pay | Admitting: Cardiovascular Disease

## 2018-05-14 VITALS — BP 108/74 | HR 91 | Ht 73.0 in | Wt 297.0 lb

## 2018-05-14 DIAGNOSIS — I2699 Other pulmonary embolism without acute cor pulmonale: Secondary | ICD-10-CM | POA: Diagnosis not present

## 2018-05-14 DIAGNOSIS — I5022 Chronic systolic (congestive) heart failure: Secondary | ICD-10-CM | POA: Diagnosis not present

## 2018-05-14 MED ORDER — RIVAROXABAN 10 MG PO TABS: 10.0000 mg | ORAL_TABLET | Freq: Every day | ORAL | 3 refills | Status: DC

## 2018-05-14 MED ORDER — LOSARTAN POTASSIUM 50 MG PO TABS: 25.0000 mg | ORAL_TABLET | Freq: Every day | ORAL | 3 refills | Status: DC

## 2018-05-14 NOTE — Progress Notes (Signed)
Cardiology Office Note   Date:  05/14/2018   ID:  Ross [name], DOB 17-Nov-1955, MRN [id number]  PCP:  Lawerance Cruel, MD  Cardiologist:   Mertie Moores, MD   Chief Complaint  Patient presents with  . Congestive Heart Failure  . Atrial Fibrillation   Problem List 1. Recurrent Pulmonary Emboli 2. Chronic systolic CHF 3. PVCs   4.  Chronic lymphocytic lymphoma   Nov. 20, 2016:    Ross [name] is a 62 y.o. male who presents for follow up of his recent hospitalization for recurrent pulmonary emboli .   Was found to have mildly decreased LV systolic function .   Was discharged with Lovenox for 1 month.  Now is on Xarelto 15 mg BID and is scheduled to change to Xarelto 20 mg a day following 21 days of the 15 BID dosing .  Marland Kitchen  This recommendation was given by hematology .   Has seen by Dr. Melvyn Novas.    Has done well since his DC.  No CP .  Breathing was ok  Feb. 27, 2017  Ross [name] is seen for follow up for his CHF and pulmonary emboli.   Was seen with his wife , Ross [name].    They have a house up in [place], [place].   He went to [place].  [place].  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  His Breathing is good. Walks and does yard work occasionally .  No Cp , no dyspnea  Oct 27, 2015  Ross [name] is seen today for follow up of his chronic systolic CHF - EF 36%.  Also has hx of pulmonary embolus. He and Ross [name] are moving to [place]. Able to do lots of work without issues.   No CP or dyspnea   Oct. 23, 2017:  Ross [name] is seen today for follow up of his CHF and pulmonary embolus  Still splitting time between here and [place] BP and HR are well controlled.   Getting some exercise - walking 3-5 miles a day .   Walks the roads  Has frequent PVCs on ECG He cannot feel them   06/20/2016: Ross [name] is seen for follow-up of his congestive heart failure. He has frequent premature ventricular  contractions. A Holter monitor revealed significant ventricular ectopy.   He had a PVC burden of 22%.    He was seen by electrophysiology. Because of his multiple PVC morphologies he was not a good candidate for catheter ablation. Dr. Lovena Le recommended a period of watchful waiting. They discussed antiarrhythmic therapy but concluded that there was no absolute indication for treatment/suppression at this time. Dr. Lovena Le recommended yearly echoes.  Getting a little exercise.   Is moving to a condo. No CP or dyspnea.     Sept. 10, 2018:  Ross [name] is doing well Has been having night sweats,   Has been started on Ibrutinib.  Stable from a cardiac standpoint. Walks 3-4 miles a day  Walks up in [place] on campus . Just started golfing   September 11, 2017: Doing well from a cardiac standpoint.   Is on Ibrutinib 140 mg a day ( caused him to bleed easily  With this along with the Xarelto )  Needs to have Mohs surgery on a basal cell cencer  Breathing is going well. Walks 2-3 miles a day .   Dec. 16, 2019: Ross [name] is seen for follow up for his CHF,  frequent PVCs, and PAF  Recent monitor reveals only 2% PVC burden.  His previous monitor revealed 22% PVC burden. He had an echo in June, 2019  Shows normal EF of 60-65%.   Exercising for the most part   Is having some bleeding in his right eye.   Had a hypotensive episode.  He has been losing weight and has been eating less.  The episode of hypotension prevented him from having an eye injection.  His eye surgeon needs clearance to have his eye injected in the next several weeks.   Past Medical History:  Diagnosis Date  . Acute meniscal tear of knee LEFT  . Cardiomyopathy ([place])    a. Dx 10/16: EF 40-45 // Nuc stress 10/16: Low risk; small, severe, fixed apical defect c/w apical thinning; no ischemia; EF 45 with global hypokinesis; moderate LVE. // Echo 11/17: Mod LVH, EF 50-55 ant-sept HK, Gr 1 DD, aortic root 40 mm, mildly dilated asc aorta, mild MVP,  trivial MR, mod LAE, mild RAE, trivial TR // Echo 6/19 [facility]):  EF 60-65, mild MR mod MVP   . Chronic systolic CHF (congestive heart failure) ([place]) 04/20/2015  . 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 CLL/SLL followed at [facility].  . DVT of leg (deep venous thrombosis) ([place])   . Habitual alcohol use   . PE (pulmonary embolism)    a. Multiple bilateral PEs on CT 07/2014. Initially tx with Lovenox/Xarelto x 6 months, but readmitted 02/2015 with recurrent acute on chronic PE.- Lifelong anticoag   . PVC's (premature ventricular contractions)    a. Frequent, noted on telemetry during 02/2015 admisson - also seen on EKG in 07/2014. // Holter 2017 - 22% PVC burden - eval by EP >> multiple morphologies; not a candidate for ablation // needs yearly echo to assess for LV dysfunction     Past Surgical History:  Procedure Laterality Date  . FATTY CYST REMOVED FROM FLANK AND BACK     FLANK- 15 YRS AGO/  BACK 12 YRS AGO  . KNEE ARTHROSCOPY  07/22/2011   Procedure: ARTHROSCOPY KNEE;  Surgeon: Magnus Sinning, MD;  Location: [facility];  Service: Orthopedics;  Laterality: Left;  Partial medial menisectomy and shaving of the patella  . LEFT INGUINAL HERNIA REPAIR W/ MESH  06-10-2005  . TONSILLECTOMY       Current Outpatient Medications  Medication Sig Dispense Refill  . ibrutinib (IMBRUVICA) 140 MG capsul Take 3 capsules by mouth daily. Start 02/13/17    . metoprolol succinate (TOPROL-XL) 50 MG 24 hr tablet Take 1 tablet (50 mg total) by mouth daily. Take with or immediately following a meal. 90 tablet 1  . losartan (COZAAR) 50 MG tablet Take 0.5 tablets (25 mg total) by mouth daily. 45 tablet 3  . rivaroxaban (XARELTO) 10 MG TABS tablet Take 1 tablet (10 mg total) by mouth daily. 90 tablet 3   No current facility-administered medications for this visit.     Allergies:   Patient has no known allergies.    Social History:  The patient  reports that he has never  smoked. He has never used smokeless tobacco. He reports current alcohol use. He reports that he does not use drugs.   Family History:  The patient's family history includes Cancer in his father.    ROS:   Noted in current history, otherwise review of systems is negative.  Physical Exam: Blood pressure 108/74, pulse 91, height 6\' 1"  (1.854 m), weight 297  lb (134.7 kg), SpO2 92 %.  GEN:  Well nourished, well developed in no acute distress HEENT: Normal NECK: No JVD; No carotid bruits,   Gums are bleeding  LYMPHATICS: No lymphadenopathy CARDIAC: RRR ,  Occasional PVCs  RESPIRATORY:  Clear to auscultation without rales, wheezing or rhonchi  ABDOMEN: Soft, non-tender, non-distended MUSCULOSKELETAL:  No edema; No deformity  SKIN: Warm and dry NEUROLOGIC:  Alert and oriented x 3   EKG:    Recent Labs: No results found for requested labs within last 8760 hours.    Lipid Panel    Component Value Date/Time   CHOL 157 03/18/2015 0330   TRIG 104 03/18/2015 0330   HDL 33 (L) 03/18/2015 0330   CHOLHDL 4.8 03/18/2015 0330   VLDL 21 03/18/2015 0330   LDLCALC 103 (H) 03/18/2015 0330      Wt Readings from Last 3 Encounters:  05/14/18 297 lb (134.7 kg)  03/27/18 (!) 308 lb 12.8 oz (140.1 kg)  09/11/17 (!) 303 lb (137.4 kg)      Other studies Reviewed: Additional studies/ records that were reviewed today include: . Review of the above records demonstrates:    ASSESSMENT AND PLAN:  1.  Mild chronic systolic congestive heart failure: Recent echocardiogram in June, 2019 reveals normal left ventricular systolic function.  We will continue to follow.  He is having some episodes of hypotension so we will need to decrease the losartan down to 25 mg a day.    2. Recurrent pulmonary emboli.   He is currently on Xarelto 20 mg a day.  There is an indication for prevention of DVT and recurrent pulmonary emboli using Xarelto 10 mg a day.  He is having lots of gum bleeding.  Apparently this is  also because he is taking the Imbruvica.   At present there is no evidence of atrial fibrillation.   3. Frequent premature ventricular contractions:    Has 22% PVC burden.  Was not a candidate for ablation.   The most recent Holter monitor revealed a PVC burden of 2% which is significantly better.  .  Episode of hypotension: Uel is been eating less and has been losing weight.  He is had some episodes of orthostatic hypotension.  One occasion occurred when he was at his eye doctor preparing to have an eye injection.  The eye doctor did not want to inject his eye because of his low blood pressure. Is feeling better.  We will also lower the dose of the losartan to 25 mg a day.  Abdurahman is at low risk for his injection and I am writing a letter of clearance. Fax to Dr. Shaune Leeks [phone]   I'll see him in 6 months for follow-up visit.  Current medicines are reviewed at length with the patient today.  The patient does not have concerns regarding medicines.  The following changes have been made:  no change  Labs/ tests ordered today include:  No orders of the defined types were placed in this encounter.   Mertie Moores, MD  05/14/2018 9:08 AM    [address], [place], [place]  [address] Phone: [phone]; Fax: [phone]

## 2018-05-14 NOTE — Patient Instructions (Addendum)
Medication Instructions:  Your physician has recommended you make the following change in your medication:  DECREASE Xarelto to 10 mg once daily DECREASE Losartan (Cozaar) 25 mg once daily  If you need a refill on your cardiac medications before your next appointment, please call your pharmacy.   Lab work: None Ordered    Testing/Procedures: None Ordered   Follow-Up: At [facility], you and your health needs are our priority.  As part of our continuing mission to provide you with exceptional heart care, we have created designated Provider Care Teams.  These Care Teams include your primary Cardiologist (physician) and Advanced Practice Providers (APPs -  Physician Assistants and Nurse Practitioners) who all work together to provide you with the care you need, when you need it. You will need a follow up appointment in:  6 months.  Please call our office 2 months in advance to schedule this appointment.  You may see Mertie Moores, MD or one of the following Advanced Practice Providers on your designated Care Team: Richardson Dopp, PA-C [place], [place] . Daune Perch, NP

## 2018-05-17 ENCOUNTER — Encounter (HOSPITAL_BASED_OUTPATIENT_CLINIC_OR_DEPARTMENT_OTHER): Payer: BLUE CROSS/BLUE SHIELD

## 2018-06-04 ENCOUNTER — Encounter (HOSPITAL_BASED_OUTPATIENT_CLINIC_OR_DEPARTMENT_OTHER): Payer: BLUE CROSS/BLUE SHIELD

## 2018-09-07 ENCOUNTER — Other Ambulatory Visit: Payer: Self-pay | Admitting: Cardiovascular Disease

## 2018-11-06 ENCOUNTER — Telehealth: Payer: Self-pay | Admitting: Cardiovascular Disease

## 2018-11-06 MED ORDER — LOSARTAN POTASSIUM 25 MG PO TABS: 25.0000 mg | ORAL_TABLET | Freq: Every day | ORAL | 3 refills | Status: DC

## 2018-11-06 MED ORDER — RIVAROXABAN 10 MG PO TABS: 10.0000 mg | ORAL_TABLET | Freq: Every day | ORAL | 3 refills | Status: DC

## 2018-11-06 NOTE — Telephone Encounter (Signed)
New Message     Pt is calling to schedule his follow up appt with Dr Acie Fredrickson but doesn't know if it needs to be a virtual visit or in office visit     Please call back

## 2018-11-06 NOTE — Telephone Encounter (Signed)
Spoke with patient who states he has no cardiac complaints and is agreeable to virtual visit with Dr. Acie Fredrickson. He is scheduled for June 23 and is aware of how to use [email or web address]. He thanked me for the call.  YOUR CARDIOLOGY TEAM HAS ARRANGED FOR AN E-VISIT FOR YOUR APPOINTMENT - PLEASE REVIEW IMPORTANT INFORMATION BELOW SEVERAL DAYS PRIOR TO YOUR APPOINTMENT  Due to the recent COVID-19 pandemic, we are transitioning in-person office visits to tele-medicine visits in an effort to decrease unnecessary exposure to our patients, their families, and staff. These visits are billed to your insurance just like a normal visit is. We also encourage you to sign up for MyChart if you have not already done so. You will need a smartphone if possible. For patients that do not have this, we can still complete the visit using a regular telephone but do prefer a smartphone to enable video when possible. You may have a family member that lives with you that can help. If possible, we also ask that you have a blood pressure cuff and scale at home to measure your blood pressure, heart rate and weight prior to your scheduled appointment. Patients with clinical needs that need an in-person evaluation and testing will still be able to come to the office if absolutely necessary. If you have any questions, feel free to call our office.   YOUR PROVIDER WILL BE USING THE FOLLOWING PLATFORM TO COMPLETE YOUR VISIT: Doxy.Me   . IF USING DOXIMITY or [email or web address].ME - The staff will give you instructions on receiving your link to join the meeting the day of your visit.    2-3 DAYS BEFORE YOUR APPOINTMENT  You will receive a telephone call from one of our [place] team members - your caller ID may say "Unknown caller." If this is a video visit, we will walk you through how to get the video launched on your phone. We will remind you check your blood pressure, heart rate and weight prior to your scheduled appointment. If you have an Apple Watch or  Kardia, please upload any pertinent ECG strips the day before or morning of your appointment to [place]. Our staff will also make sure you have reviewed the consent and agree to move forward with your scheduled tele-health visit.     THE DAY OF YOUR APPOINTMENT  Approximately 15 minutes prior to your scheduled appointment, you will receive a telephone call from one of [facility] team - your caller ID may say "Unknown caller."  Our staff will confirm medications, vital signs for the day and any symptoms you may be experiencing. Please have this information available prior to the time of visit start. It may also be helpful for you to have a pad of paper and pen handy for any instructions given during your visit. They will also walk you through joining the smartphone meeting if this is a video visit.    CONSENT FOR TELE-HEALTH VISIT - PLEASE REVIEW  I hereby voluntarily request, consent and authorize [facility] and its employed or contracted physicians, physician assistants, nurse practitioners or other licensed health care professionals (the Practitioner), to provide me with telemedicine health care services (the "Services") as deemed necessary by the treating Practitioner. I acknowledge and consent to receive the Services by the Practitioner via telemedicine. I understand that the telemedicine visit will involve communicating with the Practitioner through live audiovisual communication technology and the disclosure of certain medical information by electronic transmission. I acknowledge that I have been given the opportunity to  request an in-person assessment or other available alternative prior to the telemedicine visit and am voluntarily participating in the telemedicine visit.  I understand that I have the right to withhold or withdraw my consent to the use of telemedicine in the course of my care at any time, without affecting my right to future care or treatment, and that the Practitioner or I  may terminate the telemedicine visit at any time. I understand that I have the right to inspect all information obtained and/or recorded in the course of the telemedicine visit and may receive copies of available information for a reasonable fee.  I understand that some of the potential risks of receiving the Services via telemedicine include:  Marland Kitchen Delay or interruption in medical evaluation due to technological equipment failure or disruption; . Information transmitted may not be sufficient (e.g. poor resolution of images) to allow for appropriate medical decision making by the Practitioner; and/or  . In rare instances, security protocols could fail, causing a breach of personal health information.  Furthermore, I acknowledge that it is my responsibility to provide information about my medical history, conditions and care that is complete and accurate to the best of my ability. I acknowledge that Practitioner's advice, recommendations, and/or decision may be based on factors not within their control, such as incomplete or inaccurate data provided by me or distortions of diagnostic images or specimens that may result from electronic transmissions. I understand that the practice of medicine is not an exact science and that Practitioner makes no warranties or guarantees regarding treatment outcomes. I acknowledge that I will receive a copy of this consent concurrently upon execution via email to the email address I last provided but may also request a printed copy by calling the office of [facility].    I understand that my insurance will be billed for this visit.   I have read or had this consent read to me. . I understand the contents of this consent, which adequately explains the benefits and risks of the Services being provided via telemedicine.  . I have been provided ample opportunity to ask questions regarding this consent and the Services and have had my questions answered to my satisfaction. . I  give my informed consent for the services to be provided through the use of telemedicine in my medical care  By participating in this telemedicine visit I agree to the above.

## 2018-11-19 ENCOUNTER — Telehealth: Payer: Self-pay | Admitting: Cardiovascular Disease

## 2018-11-19 NOTE — Telephone Encounter (Signed)
Spoke with pt and he is at his home in [place].  He prefers to continue with plan for virtual visit. Reminded pt that CMA will be calling just prior to appt to go over meds and then Dr. Acie Fredrickson will send link.  Pt appreciative for call.

## 2018-11-20 ENCOUNTER — Telehealth (INDEPENDENT_AMBULATORY_CARE_PROVIDER_SITE_OTHER): Payer: BC Managed Care – PPO | Admitting: Cardiovascular Disease

## 2018-11-20 ENCOUNTER — Other Ambulatory Visit: Payer: Self-pay

## 2018-11-20 ENCOUNTER — Encounter: Payer: Self-pay | Admitting: Cardiovascular Disease

## 2018-11-20 VITALS — BP 126/80 | HR 48 | Temp 98.5°F | Ht 74.0 in | Wt 298.0 lb

## 2018-11-20 DIAGNOSIS — I5022 Chronic systolic (congestive) heart failure: Secondary | ICD-10-CM

## 2018-11-20 DIAGNOSIS — I2782 Chronic pulmonary embolism: Secondary | ICD-10-CM

## 2018-11-20 DIAGNOSIS — Z7189 Other specified counseling: Secondary | ICD-10-CM | POA: Diagnosis not present

## 2018-11-20 DIAGNOSIS — I2699 Other pulmonary embolism without acute cor pulmonale: Secondary | ICD-10-CM | POA: Diagnosis not present

## 2018-11-20 NOTE — Progress Notes (Signed)
Virtual Visit via Video Note   This visit type was conducted due to national recommendations for restrictions regarding the COVID-19 Pandemic (e.g. social distancing) in an effort to limit this patient's exposure and mitigate transmission in our community.  Due to his co-morbid illnesses, this patient is at least at moderate risk for complications without adequate follow up.  This format is felt to be most appropriate for this patient at this time.  All issues noted in this document were discussed and addressed.  A limited physical exam was performed with this format.  Please refer to the patient's chart for his consent to telehealth for [facility].   Date:  11/20/2018   ID:  Ross [name], DOB 1956-05-07, MRN [id number]  Patient Location: Home Provider Location: Office  PCP:  Lawerance Cruel, MD  Cardiologist:  Mertie Moores, MD  Electrophysiologist:  None   Problem List 1. Recurrent Pulmonary Emboli 2. Chronic systolic CHF 3. PVCs   4.  Chronic lymphocytic lymphoma   Nov. 20, 2016:    Ross [name] is a 63 y.o. male who presents for follow up of his recent hospitalization for recurrent pulmonary emboli .   Was found to have mildly decreased LV systolic function .   Was discharged with Lovenox for 1 month.  Now is on Xarelto 15 mg BID and is scheduled to change to Xarelto 20 mg a day following 21 days of the 15 BID dosing .  Marland Kitchen  This recommendation was given by hematology .   Has seen by Dr. Melvyn Novas.    Has done well since his DC.  No CP .  Breathing was ok  Feb. 27, 2017  Ross [name] is seen for follow up for his CHF and pulmonary emboli.   Was seen with his wife , [name].    They have a house up in [place], [place].   He went to [place].  They go [place] to the mountains frequently.  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  His Breathing is good. Walks and does yard work occasionally .  No Cp , no dyspnea  Oct 27, 2015  Ross [name] is seen today for follow up of his chronic systolic CHF - EF 23%.  Also has hx of pulmonary embolus. He and [name] are moving to [place]. Able to do lots of work without issues.   No CP or dyspnea   Oct. 23, 2017:  Ross [name] is seen today for follow up of his CHF and pulmonary embolus  Still splitting time between here and [place] BP and HR are well controlled.   Getting some exercise - walking 3-5 miles a day .   Walks the roads  Has frequent PVCs on ECG He cannot feel them   06/20/2016: Ross [name] is seen for follow-up of his congestive heart failure. He has frequent premature ventricular contractions. A Holter monitor revealed significant ventricular ectopy.   He had a PVC burden of 22%.    He was seen by electrophysiology. Because of his multiple PVC morphologies he was not a good candidate for catheter ablation. Dr. Lovena Le recommended a period of watchful waiting. They discussed antiarrhythmic therapy but concluded that there was no absolute indication for treatment/suppression at this time. Dr. Lovena Le recommended yearly echoes.  Getting a little exercise.   Is moving to a condo. No CP or dyspnea.     Sept. 10, 2018:  Ross [name] is doing well Has been having night  sweats,   Has been started on Ibrutinib.  Stable from a cardiac standpoint. Walks 3-4 miles a day  Walks up in [place] on campus . Just started golfing   September 11, 2017: Doing well from a cardiac standpoint.   Is on Ibrutinib 140 mg a day ( caused him to bleed easily  With this along with the Xarelto )  Needs to have Mohs surgery on a basal cell cencer  Breathing is going well. Walks 2-3 miles a day .   Dec. 16, 2019: Ross [name] is seen for follow up for his CHF, frequent PVCs, and PAF  Recent monitor reveals only 2% PVC burden.  His previous monitor revealed 22% PVC burden. He had an echo in June, 2019  Shows normal EF of 60-65%.   Exercising for the most part   Is having some bleeding in his  right eye.   Had a hypotensive episode.  He has been losing weight and has been eating less.  The episode of hypotension prevented him from having an eye injection.  His eye surgeon needs clearance to have his eye injected in the next several weeks.    Evaluation Performed:  Follow-Up Visit  Chief Complaint:  Follow up pulmonary embolus   November 20, 2018    Ross [name] is a 63 y.o. male with recurrent pulmonary emboli, chronic systolic congestive heart failure, premature ventricular contractions.  Had lost some weight.   BP was low We reduced his Losartan to 25 mg a day  Also reduced his xaredlto to 10 mg a day  He is feeling much better  Living up in [place] now.  Exercising well .    The patient does not have symptoms concerning for COVID-19 infection (fever, chills, cough, or new shortness of breath).    Past Medical History:  Diagnosis Date  . Acute meniscal tear of knee LEFT  . Cardiomyopathy ([place])    a. Dx 10/16: EF 40-45 // Nuc stress 10/16: Low risk; small, severe, fixed apical defect c/w apical thinning; no ischemia; EF 45 with global hypokinesis; moderate LVE. // Echo 11/17: Mod LVH, EF 50-55 ant-sept HK, Gr 1 DD, aortic root 40 mm, mildly dilated asc aorta, mild MVP, trivial MR, mod LAE, mild RAE, trivial TR // Echo 6/19 [facility]):  EF 60-65, mild MR mod MVP   . Chronic systolic CHF (congestive heart failure) ([place]) 04/20/2015  . 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 CLL/SLL followed at [facility].  . DVT of leg (deep venous thrombosis) ([place])   . Habitual alcohol use   . PE (pulmonary embolism)    a. Multiple bilateral PEs on CT 07/2014. Initially tx with Lovenox/Xarelto x 6 months, but readmitted 02/2015 with recurrent acute on chronic PE.- Lifelong anticoag   . PVC's (premature ventricular contractions)    a. Frequent, noted on telemetry during 02/2015 admisson - also seen on EKG in 07/2014. // Holter 2017 - 22% PVC burden - eval by EP >>  multiple morphologies; not a candidate for ablation // needs yearly echo to assess for LV dysfunction    Past Surgical History:  Procedure Laterality Date  . FATTY CYST REMOVED FROM FLANK AND BACK     FLANK- 15 YRS AGO/  BACK 12 YRS AGO  . KNEE ARTHROSCOPY  07/22/2011   Procedure: ARTHROSCOPY KNEE;  Surgeon: Magnus Sinning, MD;  Location: [facility];  Service: Orthopedics;  Laterality: Left;  Partial medial menisectomy and shaving of the patella  .  LEFT INGUINAL HERNIA REPAIR W/ MESH  06-10-2005  . TONSILLECTOMY       Current Meds  Medication Sig  . ibrutinib (IMBRUVICA) 140 MG capsul Take 3 capsules by mouth daily. Start 02/13/17  . losartan (COZAAR) 25 MG tablet Take 1 tablet (25 mg total) by mouth daily.  . metoprolol succinate (TOPROL-XL) 50 MG 24 hr tablet TAKE 1 TABLET (50 MG TOTAL) BY MOUTH DAILY. TAKE WITH OR IMMEDIATELY FOLLOWING A MEAL.  . rivaroxaban (XARELTO) 10 MG TABS tablet Take 1 tablet (10 mg total) by mouth daily.     Allergies:   Patient has no known allergies.   Social History   Tobacco Use  . Smoking status: Never Smoker  . Smokeless tobacco: Never Used  Substance Use Topics  . Alcohol use: Yes    Comment: Drinks 2 mixed drinks per day, more during social events.  . Drug use: No     Family Hx: The patient's family history includes Cancer in his father. There is no history of CAD.  ROS:   Please see the history of present illness.     All other systems reviewed and are negative.   Prior CV studies:   The following studies were reviewed today:    Labs/Other Tests and Data Reviewed:    EKG:  No ECG reviewed.  Recent Labs: No results found for requested labs within last 8760 hours.   Recent Lipid Panel Lab Results  Component Value Date/Time   CHOL 157 03/18/2015 03:30 AM   TRIG 104 03/18/2015 03:30 AM   HDL 33 (L) 03/18/2015 03:30 AM   CHOLHDL 4.8 03/18/2015 03:30 AM   LDLCALC 103 (H) 03/18/2015 03:30 AM    Wt Readings  from Last 3 Encounters:  11/05/18 298 lb (135.2 kg)  05/14/18 297 lb (134.7 kg)  03/27/18 (!) 308 lb 12.8 oz (140.1 kg)     Objective:    Vital Signs:  BP 126/80   Pulse (!) 48   Temp 98.5 F (36.9 C)   Ht 6\' 2"  (1.88 m)   Wt 298 lb (135.2 kg)   SpO2 97%   BMI 38.26 kg/m    VITAL SIGNS:  reviewed GEN:  no acute distress EYES:  sclerae anicteric, EOMI - Extraocular Movements Intact RESPIRATORY:  normal respiratory effort, symmetric expansion CARDIOVASCULAR:  no peripheral edema SKIN:  no rash, lesions or ulcers. MUSCULOSKELETAL:  no obvious deformities. NEURO:  alert and oriented x 3, no obvious focal deficit PSYCH:  normal affect  ASSESSMENT & PLAN:    1. CHronic systolic CHF:   Seems to be doing well.   Is breathing better.  exercisign daily   2.  Recurrent pulmonary emboli:  Continue  xarelto 10 mg a day     COVID-19 Education: The signs and symptoms of COVID-19 were discussed with the patient and how to seek care for testing (follow up with PCP or arrange E-visit).  The importance of social distancing was discussed today.  Time:   Today, I have spent  19  minutes with the patient with telehealth technology discussing the above problems.     Medication Adjustments/Labs and Tests Ordered: Current medicines are reviewed at length with the patient today.  Concerns regarding medicines are outlined above.   Tests Ordered: No orders of the defined types were placed in this encounter.   Medication Changes: No orders of the defined types were placed in this encounter.   Follow Up:  In Person in 6 month(s)  Signed, Arnette Norris  Gram Siedlecki, MD  11/20/2018 2:52 PM    [facility]

## 2019-05-02 ENCOUNTER — Ambulatory Visit: Payer: BC Managed Care – PPO | Admitting: Cardiovascular Disease

## 2019-05-02 ENCOUNTER — Other Ambulatory Visit: Payer: Self-pay

## 2019-05-02 ENCOUNTER — Encounter: Payer: Self-pay | Admitting: Cardiovascular Disease

## 2019-05-02 VITALS — BP 112/80 | HR 75 | Ht 74.0 in | Wt 309.0 lb

## 2019-05-02 DIAGNOSIS — I340 Nonrheumatic mitral (valve) insufficiency: Secondary | ICD-10-CM

## 2019-05-02 DIAGNOSIS — I420 Dilated cardiomyopathy: Secondary | ICD-10-CM

## 2019-05-02 DIAGNOSIS — I2782 Chronic pulmonary embolism: Secondary | ICD-10-CM | POA: Diagnosis not present

## 2019-05-02 DIAGNOSIS — I5022 Chronic systolic (congestive) heart failure: Secondary | ICD-10-CM | POA: Diagnosis not present

## 2019-05-02 DIAGNOSIS — Z7901 Long term (current) use of anticoagulants: Secondary | ICD-10-CM

## 2019-05-02 DIAGNOSIS — I4819 Other persistent atrial fibrillation: Secondary | ICD-10-CM

## 2019-05-02 MED ORDER — RIVAROXABAN 20 MG PO TABS: 20.0000 mg | ORAL_TABLET | Freq: Every day | ORAL | 3 refills | Status: DC

## 2019-05-02 NOTE — Patient Instructions (Signed)
Medication Instructions:  Your physician has recommended you make the following change in your medication:  INCREASE Xarelto ( to 20 mg once daily  *If you need a refill on your cardiac medications before your next appointment, please call your pharmacy*  Lab Work: TODAY - CBC, BMET, Liver, Cholesterol, TSH If you have labs (blood work) drawn today and your tests are completely normal, you will receive your results only by: Marland Kitchen MyChart Message (if you have MyChart) OR . A paper copy in the mail If you have any lab test that is abnormal or we need to change your treatment, we will call you to review the results.   Testing/Procedures: None Ordered   Follow-Up: At [facility], you and your health needs are our priority.  As part of our continuing mission to provide you with exceptional heart care, we have created designated Provider Care Teams.  These Care Teams include your primary Cardiologist (physician) and Advanced Practice Providers (APPs -  Physician Assistants and Nurse Practitioners) who all work together to provide you with the care you need, when you need it.  Your next appointment:   6 month(s)  The format for your next appointment:   Either In Person or Virtual  Provider:   You may see Mertie Moores, MD or one of the following Advanced Practice Providers on your designated Care Team:    Richardson Dopp, PA-C  [place], [place]  Daune Perch, [place]

## 2019-05-02 NOTE — Progress Notes (Signed)
Cardiology Office Note   Date:  05/02/2019   ID:  Ross [name], DOB Jul 16, 1955, MRN [id number]  PCP:  Lawerance Cruel, MD  Cardiologist:   Mertie Moores, MD   Chief Complaint  Patient presents with  . Congestive Heart Failure   Problem List 1. Recurrent Pulmonary Emboli 2. Chronic systolic CHF 3. PVCs   4.  Chronic lymphocytic lymphoma 5.  Atrial Fib:    Nov. 20, 2016:    Ross [name] is a 63 y.o. male who presents for follow up of his recent hospitalization for recurrent pulmonary emboli .   Was found to have mildly decreased LV systolic function .   Was discharged with Lovenox for 1 month.  Now is on Xarelto 15 mg BID and is scheduled to change to Xarelto 20 mg a day following 21 days of the 15 BID dosing .  Marland Kitchen  This recommendation was given by hematology .   Has seen by Dr. Melvyn Novas.    Has done well since his DC.  No CP .  Breathing was ok  Feb. 27, 2017  Ross [name] is seen for follow up for his CHF and pulmonary emboli.   Was seen with his wife , Ross [name].    They have a house up in [place], [place].   He went to [place].  [place] frequently.  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  His Breathing is good. Walks and does yard work occasionally .  No Cp , no dyspnea  Oct 27, 2015  Ross [name] is seen today for follow up of his chronic systolic CHF - EF [id number].  Also has hx of pulmonary embolus. He and Ross [name] are moving to [place]. Able to do lots of work without issues.   No CP or dyspnea   Oct. 23, 2017:  Sabir is seen today for follow up of his CHF and pulmonary embolus  Still splitting time between here and [place] BP and HR are well controlled.   Getting some exercise - walking 3-5 miles a day .   Walks the roads  Has frequent PVCs on ECG He cannot feel them   06/20/2016: Ross [name] is seen for follow-up of his congestive heart failure. He has frequent premature ventricular contractions.  A Holter monitor revealed significant ventricular ectopy.   He had a PVC burden of 22%.    He was seen by electrophysiology. Because of his multiple PVC morphologies he was not a good candidate for catheter ablation. Dr. Lovena Le recommended a period of watchful waiting. They discussed antiarrhythmic therapy but concluded that there was no absolute indication for treatment/suppression at this time. Dr. Lovena Le recommended yearly echoes.  Getting a little exercise.   Is moving to a condo. No CP or dyspnea.     Sept. 10, 2018:  Ross [name] is doing well Has been having night sweats,   Has been started on Ibrutinib.  Stable from a cardiac standpoint. Walks 3-4 miles a day  Walks up in [place] on campus . Just started golfing   September 11, 2017: Doing well from a cardiac standpoint.   Is on Ibrutinib 140 mg a day ( caused him to bleed easily  With this along with the Xarelto )  Needs to have Mohs surgery on a basal cell cencer  Breathing is going well. Walks 2-3 miles a day .   Dec. 16, 2019: Ross [name] is seen for follow up for his  CHF, frequent PVCs, and PAF  Recent monitor reveals only 2% PVC burden.  His previous monitor revealed 22% PVC burden. He had an echo in June, 2019  Shows normal EF of 60-65%.   Exercising for the most part   Is having some bleeding in his right eye.   Had a hypotensive episode.  He has been losing weight and has been eating less.  The episode of hypotension prevented him from having an eye injection.  His eye surgeon needs clearance to have his eye injected in the next several weeks.  May 02, 2019:    Ross [name]  Is seen today for follow-up of his congestive heart failure, atrial fibrillation, frequent premature ventricular contractions. Wt today is 309      Past Medical History:  Diagnosis Date  . Acute meniscal tear of knee LEFT  . Cardiomyopathy ([place])    a. Dx 10/16: EF 40-45 // Nuc stress 10/16: Low risk; small, severe, fixed apical defect c/w apical thinning; no  ischemia; EF 45 with global hypokinesis; moderate LVE. // Echo 11/17: Mod LVH, EF 50-55 ant-sept HK, Gr 1 DD, aortic root 40 mm, mildly dilated asc aorta, mild MVP, trivial MR, mod LAE, mild RAE, trivial TR // Echo 6/19 [facility]):  EF 60-65, mild MR mod MVP   . Chronic systolic CHF (congestive heart failure) ([place]) 04/20/2015  . CLL (chronic lymphocytic leukemia) ([place]) 03/16/2015   a. CLL/SLL followed at [facility].  . DVT of leg (deep venous thrombosis) ([place])   . Habitual alcohol use   . PE (pulmonary embolism)    a. Multiple bilateral PEs on CT 07/2014. Initially tx with Lovenox/Xarelto x 6 months, but readmitted 02/2015 with recurrent acute on chronic PE.- Lifelong anticoag   . PVC's (premature ventricular contractions)    a. Frequent, noted on telemetry during 02/2015 admisson - also seen on EKG in 07/2014. // Holter 2017 - 22% PVC burden - eval by EP >> multiple morphologies; not a candidate for ablation // needs yearly echo to assess for LV dysfunction     Past Surgical History:  Procedure Laterality Date  . FATTY CYST REMOVED FROM FLANK AND BACK     FLANK- 15 YRS AGO/  BACK 12 YRS AGO  . KNEE ARTHROSCOPY  07/22/2011   Procedure: ARTHROSCOPY KNEE;  Surgeon: Magnus Sinning, MD;  Location: [facility];  Service: Orthopedics;  Laterality: Left;  Partial medial menisectomy and shaving of the patella  . LEFT INGUINAL HERNIA REPAIR W/ MESH  06-10-2005  . TONSILLECTOMY       Current Outpatient Medications  Medication Sig Dispense Refill  . ibrutinib (IMBRUVICA) 140 MG capsul Take 3 capsules by mouth daily. Start 02/13/17    . losartan (COZAAR) 25 MG tablet Take 1 tablet (25 mg total) by mouth daily. 90 tablet 3  . metoprolol succinate (TOPROL-XL) 50 MG 24 hr tablet TAKE 1 TABLET (50 MG TOTAL) BY MOUTH DAILY. TAKE WITH OR IMMEDIATELY FOLLOWING A MEAL. 90 tablet 3  . Ranibizumab (LUCENTIS IO) Inject into the eye. Eye injection every 8-10 weeks    . rivaroxaban (XARELTO) 20 MG TABS  tablet Take 1 tablet (20 mg total) by mouth daily with supper. 90 tablet 3   No current facility-administered medications for this visit.     Allergies:   Patient has no known allergies.    Social History:  The patient  reports that he has never smoked. He has never used smokeless tobacco. He reports current alcohol use. He reports  that he does not use drugs.   Family History:  The patient's family history includes Cancer in his father.    ROS:   Noted in current history, otherwise review of systems is negative.  Physical Exam: Blood pressure 112/80, pulse 75, height 6\' 2"  (1.88 m), weight (!) 309 lb (140.2 kg), SpO2 96 %.  GEN:  Middle age, moderately obese gentleman, no acute distress HEENT: Normal NECK: No JVD; No carotid bruits LYMPHATICS: No lymphadenopathy CARDIAC: Irreg. Irreg  RESPIRATORY:  Clear to auscultation without rales, wheezing or rhonchi  ABDOMEN: Soft, non-tender, non-distended MUSCULOSKELETAL:  No edema; No deformity  SKIN: Warm and dry NEUROLOGIC:  Alert and oriented x 3   EKG:    Dec. 3, 2020   Atrial fib with HR of 75.  Occasional PVCs .  Previous INf. MI   Recent Labs: 05/02/2019: ALT 9; BUN 12; Creatinine, Ser 0.92; Hemoglobin WILL FOLLOW; Platelets WILL FOLLOW; Potassium 4.2; Sodium 141; TSH 2.420    Lipid Panel    Component Value Date/Time   CHOL 156 05/02/2019 1017   TRIG 82 05/02/2019 1017   HDL 55 05/02/2019 1017   CHOLHDL 2.8 05/02/2019 1017   CHOLHDL 4.8 03/18/2015 0330   VLDL 21 03/18/2015 0330   LDLCALC 85 05/02/2019 1017      Wt Readings from Last 3 Encounters:  05/02/19 (!) 309 lb (140.2 kg)  11/05/18 298 lb (135.2 kg)  05/14/18 297 lb (134.7 kg)      Other studies Reviewed: Additional studies/ records that were reviewed today include: . Review of the above records demonstrates:    ASSESSMENT AND PLAN:  1.  Mild chronic systolic congestive heart failure: Recent echocardiogram in June, 2019 reveals normal left ventricular  systolic function.   EF has been normal  Consider echo next year    2.   Atrial Fib:   Has developed persistent AF.  Asymptomatic.   Increase xrelto to 20 mg a day . Was on xarelto 10 mg for DVT prophylasix    2. Recurrent pulmonary emboli.   Cont xarelto   3. Frequent premature ventricular contractions:    Has 22% PVC burden.  Was not a candidate for ablation.   The most recent Holter monitor revealed a PVC burden of 2% which is significantly better.  .  Episode of hypotension: Ross [name] is been eating less and has been losing weight.  He is had some episodes of orthostatic hypotension.  One occasion occurred when he was at his eye doctor preparing to have an eye injection.  The eye doctor did not want to inject his eye because of his low blood pressure. Is feeling better.  We will also lower the dose of the losartan to 25 mg a day.  Ross [name] is at low risk for his injection and I am writing a letter of clearance. Fax to Dr. Shaune Leeks [phone]   I'll see him in 6 months for follow-up visit.  Current medicines are reviewed at length with the patient today.  The patient does not have concerns regarding medicines.  The following changes have been made:  no change  Labs/ tests ordered today include:   Orders Placed This Encounter  Procedures  . TSH  . Basic Metabolic Panel (BMET)  . Hepatic function panel  . Lipid Profile  . CBC  . EKG 12-Lead    Mertie Moores, MD  05/02/2019 6:06 PM    [facility] [place], [place], [place]  [address] Phone: [phone])  [phone]; Fax: [phone]

## 2019-05-03 LAB — BASIC METABOLIC PANEL
BUN/Creatinine Ratio: 13 (ref 10–24)
BUN: 12 mg/dL (ref 8–27)
CO2: 20 mmol/L (ref 20–29)
Calcium: 8.5 mg/dL — ABNORMAL LOW (ref 8.6–10.2)
Chloride: 107 mmol/L — ABNORMAL HIGH (ref 96–106)
Creatinine, Ser: 0.92 mg/dL (ref 0.76–1.27)
GFR calc Af Amer: 103 mL/min/{1.73_m2} (ref >59)
GFR calc non Af Amer: 89 mL/min/{1.73_m2} (ref >59)
Glucose: 93 mg/dL (ref 65–99)
Potassium: 4.2 mmol/L (ref 3.5–5.2)
Sodium: 141 mmol/L (ref 134–144)

## 2019-05-03 LAB — LIPID PANEL
Chol/HDL Ratio: 2.8 ratio (ref 0.0–5.0)
Cholesterol, Total: 156 mg/dL (ref 100–199)
HDL: 55 mg/dL (ref >39)
LDL Chol Calc (NIH): 85 mg/dL (ref 0–99)
Triglycerides: 82 mg/dL (ref 0–149)
VLDL Cholesterol Cal: 16 mg/dL (ref 5–40)

## 2019-05-03 LAB — HEPATIC FUNCTION PANEL
ALT: 9 IU/L (ref 0–44)
AST: 18 IU/L (ref 0–40)
Albumin: 4 g/dL (ref 3.8–4.8)
Alkaline Phosphatase: 63 IU/L (ref 39–117)
Bilirubin Total: 1.7 mg/dL — ABNORMAL HIGH (ref 0.0–1.2)
Bilirubin, Direct: 0.46 mg/dL — ABNORMAL HIGH (ref 0.00–0.40)
Total Protein: 5.9 g/dL — ABNORMAL LOW (ref 6.0–8.5)

## 2019-05-03 LAB — CBC
Hematocrit: 53.1 % — ABNORMAL HIGH (ref 37.5–51.0)
Hemoglobin: 18.7 g/dL — ABNORMAL HIGH (ref 13.0–17.7)
MCH: 32.9 pg (ref 26.6–33.0)
MCHC: 35.2 g/dL (ref 31.5–35.7)
MCV: 93 fL (ref 79–97)
Platelets: 125 10*3/uL — ABNORMAL LOW (ref 150–450)
RBC: 5.69 x10E6/uL (ref 4.14–5.80)
RDW: 15.1 % (ref 11.6–15.4)
WBC: 5.6 10*3/uL (ref 3.4–10.8)

## 2019-05-03 LAB — TSH: TSH: 2.42 u[IU]/mL (ref 0.450–4.500)

## 2019-08-11 ENCOUNTER — Other Ambulatory Visit: Payer: Self-pay | Admitting: Cardiovascular Disease

## 2019-11-18 ENCOUNTER — Encounter: Payer: Self-pay | Admitting: Cardiovascular Disease

## 2019-11-18 ENCOUNTER — Ambulatory Visit: Payer: BC Managed Care – PPO | Admitting: Cardiovascular Disease

## 2019-11-18 ENCOUNTER — Other Ambulatory Visit: Payer: Self-pay

## 2019-11-18 VITALS — BP 110/74 | HR 76 | Ht 74.0 in | Wt 296.8 lb

## 2019-11-18 DIAGNOSIS — I4891 Unspecified atrial fibrillation: Secondary | ICD-10-CM | POA: Insufficient documentation

## 2019-11-18 DIAGNOSIS — I5022 Chronic systolic (congestive) heart failure: Secondary | ICD-10-CM | POA: Diagnosis not present

## 2019-11-18 DIAGNOSIS — I4811 Longstanding persistent atrial fibrillation: Secondary | ICD-10-CM | POA: Diagnosis not present

## 2019-11-18 NOTE — Patient Instructions (Signed)

## 2019-11-18 NOTE — Progress Notes (Signed)
Cardiology Office Note   Date:  11/18/2019   ID:  Ross [name], DOB Jan 25, 1956, MRN [id number]  PCP:  Lawerance Cruel, MD  Cardiologist:   Mertie Moores, MD   Chief Complaint  Patient presents with  . Congestive Heart Failure   Problem List 1. Recurrent Pulmonary Emboli 2. Chronic systolic CHF 3. PVCs   4.  Chronic lymphocytic lymphoma 5.  Atrial Fib:    Nov. 20, 2016:    TEAGON KRON is a 64 y.o. male who presents for follow up of his recent hospitalization for recurrent pulmonary emboli .   Was found to have mildly decreased LV systolic function .   Was discharged with Lovenox for 1 month.  Now is on Xarelto 15 mg BID and is scheduled to change to Xarelto 20 mg a day following 21 days of the 15 BID dosing .  Marland Kitchen  This recommendation was given by hematology .   Has seen by Dr. Melvyn Novas.    Has done well since his DC.  No CP .  Breathing was ok  Feb. 27, 2017  Raeden is seen for follow up for his CHF and pulmonary emboli.   Was seen with his wife , [name].    They have a house up in [place], [place].   He went to [place].  [place] the [place] frequently.  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  His Breathing is good. Walks and does yard work occasionally .  No Cp , no dyspnea  Oct 27, 2015  Olis is seen today for follow up of his chronic systolic CHF - EF 76%.  Also has hx of pulmonary embolus. He and [name] are moving to [place]. Able to do lots of work without issues.   No CP or dyspnea   Oct. 23, 2017:  Trace is seen today for follow up of his CHF and pulmonary embolus  Still splitting time between here and [place] BP and HR are well controlled.   Getting some exercise - walking 3-5 miles a day .   Walks the roads  Has frequent PVCs on ECG He cannot feel them   06/20/2016: Derrill is seen for follow-up of his congestive heart failure. He has frequent premature ventricular contractions.  A Holter monitor revealed significant ventricular ectopy.   He had a PVC burden of 22%.    He was seen by electrophysiology. Because of his multiple PVC morphologies he was not a good candidate for catheter ablation. Dr. Lovena Le recommended a period of watchful waiting. They discussed antiarrhythmic therapy but concluded that there was no absolute indication for treatment/suppression at this time. Dr. Lovena Le recommended yearly echoes.  Getting a little exercise.   Is moving to a condo. No CP or dyspnea.     Sept. 10, 2018:  Demetrie is doing well Has been having night sweats,   Has been started on Ibrutinib.  Stable from a cardiac standpoint. Walks 3-4 miles a day  Walks up in [place] on campus . Just started golfing   September 11, 2017: Doing well from a cardiac standpoint.   Is on Ibrutinib 140 mg a day ( caused him to bleed easily  With this along with the Xarelto )  Needs to have Mohs surgery on a basal cell cencer  Breathing is going well. Walks 2-3 miles a day .   Dec. 16, 2019: Jujuan is seen for follow up for his  CHF, frequent PVCs, and PAF  Recent monitor reveals only 2% PVC burden.  His previous monitor revealed 22% PVC burden. He had an echo in June, 2019  Shows normal EF of 60-65%.   Exercising for the most part   Is having some bleeding in his right eye.   Had a hypotensive episode.  He has been losing weight and has been eating less.  The episode of hypotension prevented him from having an eye injection.  His eye surgeon needs clearance to have his eye injected in the next several weeks.  May 02, 2019:    Arkel  Is seen today for follow-up of his congestive heart failure, atrial fibrillation, frequent premature ventricular contractions. Wt today is 39     November 18, 2019:  Maveryck is seen today for follow-up visit.  His weight is down 13 pounds from his last visit.  Weight today is 296 pounds.  Working out daily.   No problems.    Has eye issues.  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 Acute meniscal tear of knee LEFT  . Cardiomyopathy ([place])    a. Dx 10/16: EF 40-45 // Nuc stress 10/16: Low risk; small, severe, fixed apical defect c/w apical thinning; no ischemia; EF 45 with global hypokinesis; moderate LVE. // Echo 11/17: Mod LVH, EF 50-55 ant-sept HK, Gr 1 DD, aortic root 40 mm, mildly dilated asc aorta, mild MVP, trivial MR, mod LAE, mild RAE, trivial TR // Echo 6/19 [facility]):  EF 60-65, mild MR mod MVP   . Chronic systolic CHF (congestive heart failure) ([place]) 04/20/2015  . 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 CLL/SLL followed at [facility].  . DVT of leg (deep venous thrombosis) ([place])   . Habitual alcohol use   . PE (pulmonary embolism)    a. Multiple bilateral PEs on CT 07/2014. Initially tx with Lovenox/Xarelto x 6 months, but readmitted 02/2015 with recurrent acute on chronic PE.- Lifelong anticoag   . PVC's (premature ventricular contractions)    a. Frequent, noted on telemetry during 02/2015 admisson - also seen on EKG in 07/2014. // Holter 2017 - 22% PVC burden - eval by EP >> multiple morphologies; not a candidate for ablation // needs yearly echo to assess for LV dysfunction     Past Surgical History:  Procedure Laterality Date  . FATTY CYST REMOVED FROM FLANK AND BACK     FLANK- 15 YRS AGO/  BACK 12 YRS AGO  . KNEE ARTHROSCOPY  07/22/2011   Procedure: ARTHROSCOPY KNEE;  Surgeon: Magnus Sinning, MD;  Location: [facility];  Service: Orthopedics;  Laterality: Left;  Partial medial menisectomy and shaving of the patella  . LEFT INGUINAL HERNIA REPAIR W/ MESH  06-10-2005  . TONSILLECTOMY       Current Outpatient Medications  Medication Sig Dispense Refill  . ibrutinib (IMBRUVICA) 140 MG capsul Take 3 capsules by mouth daily. Start 02/13/17    . losartan (COZAAR) 25 MG tablet Take 1 tablet (25 mg total) by mouth  daily. 90 tablet 3  . metoprolol succinate (TOPROL-XL) 50 MG 24 hr tablet TAKE 1 TABLET (50 MG TOTAL) BY MOUTH DAILY. TAKE WITH OR IMMEDIATELY FOLLOWING A MEAL. 90 tablet 3  . Ranibizumab (LUCENTIS IO) Inject into the eye. Eye injection every 8-10 weeks    . rivaroxaban (XARELTO) 20 MG TABS tablet  Take 1 tablet (20 mg total) by mouth daily with supper. 90 tablet 3   No current facility-administered medications for this visit.    Allergies:   Patient has no known allergies.    Social History:  The patient  reports that he has never smoked. He has never used smokeless tobacco. He reports current alcohol use. He reports that he does not use drugs.   Family History:  The patient's family history includes Cancer in his father.    ROS:   Noted in current history, otherwise review of systems is negative.  Physical Exam: Blood pressure 110/74, pulse 76, height 6\' 2"  (1.88 m), weight 296 lb 12 oz (134.6 kg), SpO2 95 %.  GEN:  Well nourished, well developed in no acute distress. moderatlly obese  HEENT: Normal NECK: No JVD; No carotid bruits LYMPHATICS: No lymphadenopathy CARDIAC: irreg. Irreg. , no murmurs, rubs, gallops RESPIRATORY:  Clear to auscultation without rales, wheezing or rhonchi  ABDOMEN: Soft, non-tender, non-distended MUSCULOSKELETAL:   sl8ght stasis changes.   Trace edema   SKIN: Warm and dry NEUROLOGIC:  Alert and oriented x 3   EKG:       Recent Labs: 05/02/2019: ALT 9; BUN 12; Creatinine, Ser 0.92; Hemoglobin 18.7; Platelets 125; Potassium 4.2; Sodium 141; TSH 2.420    Lipid Panel    Component Value Date/Time   CHOL 156 05/02/2019 1017   TRIG 82 05/02/2019 1017   HDL 55 05/02/2019 1017   CHOLHDL 2.8 05/02/2019 1017   CHOLHDL 4.8 03/18/2015 0330   VLDL 21 03/18/2015 0330   LDLCALC 85 05/02/2019 1017      Wt Readings from Last 3 Encounters:  11/18/19 296 lb 12 oz (134.6 kg)  05/02/19 (!) 309 lb (140.2 kg)  11/05/18 298 lb (135.2 kg)      Other studies  Reviewed: Additional studies/ records that were reviewed today include: . Review of the above records demonstrates:    ASSESSMENT AND PLAN:  1.  Mild chronic systolic congestive heart failure: Recent echocardiogram in June, 2019 reveals normal left ventricular systolic function.   Continue current medications.  Is not having any symptoms. Is losing weight.   Breathing is better.    2.   Atrial Fib:   Has persistent atrial fibrillation.  Continue Xarelto.  2. Recurrent pulmonary emboli.    Continue Xarelto.   3. Frequent premature ventricular contractions:         Will see in 1 year    Current medicines are reviewed at length with the patient today.  The patient does not have concerns regarding medicines.  The following changes have been made:  no change  Labs/ tests ordered today include:   No orders of the defined types were placed in this encounter.   Mertie Moores, MD  11/18/2019 6:05 PM    [address], [place], [place]  [address] Phone: [phone]; Fax: [phone]

## 2020-01-22 ENCOUNTER — Other Ambulatory Visit: Payer: Self-pay | Admitting: Cardiovascular Disease

## 2020-01-22 NOTE — Telephone Encounter (Signed)
Xarelto 10mg  is not what the pt is taking. He is taking Xarelto 20mg  and it was sent on 05/02/2019 for a year supply and per the pharmacist it was last filled on 10/2019. The pharmacist will remove from his profile and aware that I am denying this one.

## 2020-04-19 ENCOUNTER — Other Ambulatory Visit: Payer: Self-pay | Admitting: Cardiovascular Disease

## 2020-04-20 NOTE — Telephone Encounter (Signed)
Pt last saw Dr Acie Fredrickson 11/18/19, last labs 05/02/19 Creat 0.92, age 64, weight 134.6kg, CrCl 156.46, based on CrCl pt is on appropriate dosage of Xarelto 20mg  QD.  Will refill rx.

## 2020-07-16 ENCOUNTER — Other Ambulatory Visit: Payer: Self-pay | Admitting: Cardiovascular Disease

## 2020-09-25 ENCOUNTER — Telehealth: Payer: Self-pay | Admitting: *Deleted

## 2020-09-25 NOTE — Telephone Encounter (Signed)
Patient was returning call 

## 2020-09-25 NOTE — Telephone Encounter (Signed)
Left message to call back on 09/25/2020 at 12:45 PM.  Patient is call back.

## 2020-09-25 NOTE — Telephone Encounter (Signed)
   Primary Cardiologist: Mertie Moores, MD  Chart reviewed as part of pre-operative protocol coverage. Given past medical history and time since last visit, based on ACC/AHA guidelines, Ross [name] would be at acceptable risk for the planned procedure without further cardiovascular testing.   Patient with diagnosis of recurrent PE and afib on Xarelto for anticoagulation.    Procedure: colonoscopy Date of procedure: 10/02/20  CHA2DS2-VASc Score = 2  This indicates a 2.2% annual risk of stroke. The patient's score is based upon: CHF History: Yes HTN History: No Diabetes History: No Stroke History: No Vascular Disease History: Yes Age Score: 0 Gender Score: 0     CrCl 122 ml/min Platelet count 109  Due to patient's history of recurrent PE would recommend patient hold Xarelto only 1 day prior to procedure.  Patient was advised that if he develops new symptoms prior to surgery to contact our office to arrange a follow-up appointment.  He verbalized understanding.  I will route this recommendation to the requesting party via Epic fax function and remove from pre-op pool.  Please call with questions.  Jossie Ng. Nazirah Tri NP-C    09/25/2020, 1:58 PM [facility] [place] [address] Office [phone] Fax [phone]

## 2020-09-25 NOTE — Telephone Encounter (Signed)
   [facility] Pre-operative Risk Assessment    Patient Name: Ross [name]  DOB: 05-09-1956  MRN: [id number]   HEARTCARE STAFF: - Please ensure there is not already an duplicate clearance open for this procedure. - Under Visit Info/Reason for Call, type in Other and utilize the format Clearance MM/DD/YY or Clearance TBD. Do not use dashes or single digits. - If request is for dental extraction, please clarify the # of teeth to be extracted.  Request for surgical clearance:   1. What type of surgery is being performed? COLONOSCOPY   2. When is this surgery scheduled? 10/02/20   3. What type of clearance is required (medical clearance vs. Pharmacy clearance to hold med vs. Both)? PHARM  4. Are there any medications that need to be held prior to surgery and how long? [name]   5. Practice name and name of physician performing surgery? DR. Estill Bamberg [facility]: [facility] [place]  6. What is the office phone number? [phone]   7.   What is the office fax number? [phone]  8.   Anesthesia type (None, local, MAC, general) ? PROPOFOL   Julaine Hua 09/25/2020, 11:18 AM  _________________________________________________________________   (provider comments below)

## 2020-09-25 NOTE — Telephone Encounter (Signed)
Patient with diagnosis of recurrent PE and afib on Xarelto for anticoagulation.    Procedure: colonoscopy Date of procedure: 10/02/20  CHA2DS2-VASc Score = 2  This indicates a 2.2% annual risk of stroke. The patient's score is based upon: CHF History: Yes HTN History: No Diabetes History: No Stroke History: No Vascular Disease History: Yes Age Score: 0 Gender Score: 0     CrCl 122 ml/min Platelet count 109  Due to patient's history of recurrent PE would recommend patient hold Xarelto only 1 day prior to procedure.

## 2020-10-10 ENCOUNTER — Other Ambulatory Visit: Payer: Self-pay | Admitting: Cardiovascular Disease

## 2020-10-12 NOTE — Telephone Encounter (Signed)
Xarelto 20mg  refill request received. Pt is 65 years old, weight-134.6kg, Crea-0.90 on 07/06/2020 via [facility], last seen by Dr. Acie Fredrickson on 11/18/2019, Diagnosis-Afib & PE, CrCl-159.33ml/min; Dose is appropriate based on dosing criteria. Will send in refill to requested pharmacy.

## 2021-01-05 ENCOUNTER — Other Ambulatory Visit: Payer: Self-pay | Admitting: Cardiovascular Disease

## 2021-01-07 ENCOUNTER — Other Ambulatory Visit: Payer: Self-pay | Admitting: Cardiovascular Disease

## 2021-01-08 ENCOUNTER — Other Ambulatory Visit: Payer: Self-pay | Admitting: Cardiovascular Disease

## 2021-01-08 NOTE — Telephone Encounter (Signed)
Prescription refill request for Xarelto received.  Indication: dvt. PE, afib  Last office visit: 11/18/2019 Weight: 134.6 kg  Age: 65 yo  Scr: 0.9, 01/04/2021 CrCl: 157 ml/min   Per office note from Dr. Acie Fredrickson 05/02/2019 increase Xarelto to '20mg'$  daily. Pt over due for office visit message sent to schedulers.

## 2021-01-08 NOTE — Telephone Encounter (Signed)
Appointment scheduled for 11-15

## 2021-01-27 ENCOUNTER — Telehealth: Payer: Self-pay | Admitting: *Deleted

## 2021-01-27 NOTE — Telephone Encounter (Signed)
   [facility] Pre-operative Risk Assessment    Patient Name: Ross [name]  DOB: 10-Jul-1955 MRN: [id number]  HEARTCARE STAFF:  - IMPORTANT!!!!!! Under Visit Info/Reason for Call, type in Other and utilize the format Clearance MM/DD/YY or Clearance TBD. Do not use dashes or single digits. - Please review there is not already an duplicate clearance open for this procedure. - If request is for dental extraction, please clarify the # of teeth to be extracted. - If the patient is currently at the dentist's office, call Pre-Op Callback Staff (MA/nurse) to input urgent request.  - If the patient is not currently in the dentist office, please route to the Pre-Op pool.  Request for surgical clearance:  What type of surgery is being performed? ROBOT ASSIST LAPAROSCOPIC B/L INGUINAL HERNIA REPAIR  When is this surgery scheduled? 02/17/21  What type of clearance is required (medical clearance vs. Pharmacy clearance to hold med vs. Both)? BOTH  Are there any medications that need to be held prior to surgery and how long?  [facility]   Practice name and name of physician performing surgery? [facility]; DR. Lacinda Axon  What is the office phone number? [phone]   7.   What is the office fax number? [phone]  8.   Anesthesia type (None, local, MAC, general) ? ANESTHESIA NOT LISTED    Julaine Hua 01/27/2021, 1:23 PM  _________________________________________________________________   (provider comments below)

## 2021-01-27 NOTE — Telephone Encounter (Signed)
   Name: [name]  DOB: 1956/02/03  MRN: [id number]  Primary Cardiologist: Mertie Moores, MD  Chart reviewed as part of pre-operative protocol coverage. Because of Jermichael Mayville Cordon's past medical history and time since last visit, he will require a follow-up visit in order to better assess preoperative cardiovascular risk.  Pre-op covering staff: - Please schedule appointment and call patient to inform them. If patient already had an upcoming appointment within acceptable timeframe, please add "pre-op clearance" to the appointment notes so provider is aware. - Please contact requesting surgeon's office via preferred method (i.e, phone, fax) to inform them of need for appointment prior to surgery.  If applicable, this message will also be routed to pharmacy pool and/or primary cardiologist for input on holding anticoagulant/antiplatelet agent as requested below so that this information is available to the clearing provider at time of patient's appointment.   Tami Lin Lashena Signer, PA  01/27/2021, 5:13 PM

## 2021-01-28 NOTE — Telephone Encounter (Signed)
Patient called back to make sooner appointment for surgical clearance. No appointments available for the scheduling team to use before 9/21. Please find something sooner

## 2021-01-28 NOTE — Telephone Encounter (Signed)
Left detailed message for pt to call back and make a sooner appointment, as he will need to be seen in order to be cleared for his upcoming procedure 02/17/2021.

## 2021-01-28 NOTE — Telephone Encounter (Signed)
Pt has been scheduled to see Dr. Acie Fredrickson, 02/03/21, and clearance will be addressed at that time.  Will route back to the requesting surgeon's office to make them aware.

## 2021-01-29 NOTE — Telephone Encounter (Signed)
Patient with diagnosis of recurrent PE on Xarelto for anticoagulation.    Procedure: ROBOT ASSIST LAPAROSCOPIC B/L INGUINAL HERNIA REPAIR Date of procedure: 02/17/21  CHA2DS2-VASc Score = 2  This indicates a 2.2% annual risk of stroke. The patient's score is based upon: CHF History: Yes HTN History: No Diabetes History: No Stroke History: No Vascular Disease History: Yes Age Score: 0 Gender Score: 0      CrCl 120.9 ml/min  Patient with history of recurrent PE. I will defer to Dr. Acie Fredrickson for length of hold for Xarelto and if a bridge is needed.

## 2021-01-30 ENCOUNTER — Other Ambulatory Visit: Payer: Self-pay | Admitting: Cardiovascular Disease

## 2021-02-01 ENCOUNTER — Encounter: Payer: Self-pay | Admitting: Cardiovascular Disease

## 2021-02-01 NOTE — Progress Notes (Signed)
Cardiology Office Note   Date:  02/03/2021   ID:  Ross [name], DOB August 08, 1955, MRN [id number]  PCP:  Ross Pain, DO  Cardiologist:   Ross Moores, MD   Chief Complaint  Patient presents with   Congestive Heart Failure   Problem List 1. Recurrent Pulmonary Emboli 2. Chronic systolic CHF 3. PVCs   4.  Chronic lymphocytic lymphoma 5.  Atrial Fib:    Nov. 20, 2016:    Ross [name] is a 65 y.o. male who presents for follow up of his recent hospitalization for recurrent pulmonary emboli .   Was found to have mildly decreased LV systolic function .   Was discharged with Lovenox for 1 month.  Now is on Xarelto 15 mg BID and is scheduled to change to Xarelto 20 mg a day following 21 days of the 15 BID dosing .  Marland Kitchen  This recommendation was given by hematology .   Has seen by Ross [name].    Has done well since his DC.  No CP .  Breathing was ok  Feb. 27, 2017  Ross [name] is seen for follow up for his CHF and pulmonary emboli.   Was seen with his wife , Ross [name].    They have a house up in [place], [place].   He went to [place].  [place]  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  His Breathing is good. Walks and does yard work occasionally .  No Cp , no dyspnea  Oct 27, 2015  Ross [name] is seen today for follow up of his chronic systolic CHF - EF [id number].  Also has hx of pulmonary embolus. He and Ross [name] are moving to [place]. Able to do lots of work without issues.   No CP or dyspnea   Oct. 23, 2017:  Ross [name] is seen today for follow up of his CHF and pulmonary embolus  Still splitting time between here and [place] BP and HR are well controlled.   Getting some exercise - walking 3-5 miles a day .   Walks the roads  Has frequent PVCs on ECG He cannot feel them   06/20/2016: Ross [name] is seen for follow-up of his congestive heart failure. He has frequent premature ventricular contractions. A  Holter monitor revealed significant ventricular ectopy.   He had a PVC burden of 22%.    He was seen by electrophysiology. Because of his multiple PVC morphologies he was not a good candidate for catheter ablation. Ross [name] recommended a period of watchful waiting. They discussed antiarrhythmic therapy but concluded that there was no absolute indication for treatment/suppression at this time. Ross [name] recommended yearly echoes.  Getting a little exercise.   Is moving to a condo. No CP or dyspnea.     Sept. 10, 2018:  Ross [name] is doing well Has been having night sweats,   Has been started on Ibrutinib.  Stable from a cardiac standpoint. Walks 3-4 miles a day  Walks up in [place] campus . Just started golfing   September 11, 2017: Doing well from a cardiac standpoint.   Is on Ibrutinib 140 mg a day ( caused him to bleed easily  With this along with the Xarelto )  Needs to have Mohs surgery on a basal cell cencer  Breathing is going well. Walks 2-3 miles a day .   Dec. 16, 2019: Ross [name] is seen for follow up for his  CHF, frequent PVCs, and PAF  Recent monitor reveals only 2% PVC burden.  His previous monitor revealed 22% PVC burden. He had an echo in June, 2019  Shows normal EF of 60-65%.   Exercising for the most part   Is having some bleeding in his right eye.   Had a hypotensive episode.  He has been losing weight and has been eating less.  The episode of hypotension prevented him from having an eye injection.  His eye surgeon needs clearance to have his eye injected in the next several weeks.  May 02, 2019:    Ross [name]  Is seen today for follow-up of his congestive heart failure, atrial fibrillation, frequent premature ventricular contractions. Wt today is 45     November 18, 2019:  Ross [name] is seen today for follow-up visit.  His weight is down 13 pounds from his last visit.  Weight today is 296 pounds.  Working out daily.   No problems.    Has eye issues.  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 7, 2022: Ross [name] is seen for follow up and for pre - op evaluation prior to hernia surgery . He is on Xarelto for A-fib and hx of PE  Hx of CLL   Has bought an ebike - rides 100 + miles a week.  Rides the [facility] trail.  Has lost 40 lbs.   Riding more.   Eating better .   No CP , no dyspnea.   I discussed with our pharmacy team.  We all agree that it would be acceptable for him to hold his Xarelto 1 day prior to his hernia surgery. If the surgeons think they needed help for longer, I would be okay with him holding it for 2 days.  I do not see any reason to bridge him with Lovenox given the very brief holding time of the Xarelto.   Past Medical History:  Diagnosis Date   Acute meniscal tear of knee LEFT   Cardiomyopathy ([facility])    a. Dx 10/16: EF 40-45 // Nuc stress 10/16: Low risk; small, severe, fixed apical defect c/w apical thinning; no ischemia; EF 45 with global hypokinesis; moderate LVE. // Echo 11/17: Mod LVH, EF 50-55 ant-sept HK, Gr 1 DD, aortic root 40 mm, mildly dilated asc aorta, mild MVP, trivial MR, mod LAE, mild RAE, trivial TR // Echo 6/19 [facility]):  EF 60-65, mild MR mod MVP    Chronic systolic CHF (congestive heart failure) ([place]) 04/20/2015   CLL (chronic lymphocytic leukemia) ([place]) 03/16/2015   a. CLL/SLL followed at [facility].   DVT of leg (deep venous thrombosis) ([facility])    Habitual alcohol use    PE (pulmonary embolism)    a. Multiple bilateral PEs on CT 07/2014. Initially tx with Lovenox/Xarelto x 6 months, but readmitted 02/2015 with recurrent acute on chronic PE.- Lifelong anticoag    PVC's (premature ventricular contractions)    a. Frequent, noted on telemetry during 02/2015 admisson - also seen on EKG in 07/2014. // Holter 2017 - 22% PVC burden - eval by EP >> multiple morphologies; not a candidate for ablation // needs yearly echo to assess for LV dysfunction     Past Surgical History:   Procedure Laterality Date   FATTY CYST REMOVED FROM FLANK AND BACK     FLANK- 15 YRS AGO/  BACK 12 YRS AGO   KNEE ARTHROSCOPY  07/22/2011  Procedure: ARTHROSCOPY KNEE;  Surgeon: Magnus Sinning, MD;  Location: [facility];  Service: Orthopedics;  Laterality: Left;  Partial medial menisectomy and shaving of the patella   LEFT INGUINAL HERNIA REPAIR W/ MESH  06-10-2005   TONSILLECTOMY       Current Outpatient Medications  Medication Sig Dispense Refill   ibrutinib (IMBRUVICA) 140 MG capsul Take 3 capsules by mouth daily. Start 02/13/17     losartan (COZAAR) 25 MG tablet TAKE 1 TABLET (25 MG TOTAL) BY MOUTH DAILY. PLEASE SCHEDULE APPT FOR JUNE 2022 FOR FUTURE REFILLS. 30 tablet 0   metoprolol succinate (TOPROL-XL) 50 MG 24 hr tablet Take 1 tablet (50 mg total) by mouth daily. TAKE WITH OR IMMEDIATELY FOLLOWING A MEAL. Please make an appt for future refills. Thank you 1st attempt 90 tablet 0   Ranibizumab (LUCENTIS IO) Inject into the eye. Eye injection every 8-10 weeks     XARELTO 20 MG TABS tablet TAKE 1 TABLET BY MOUTH DAILY WITH SUPPER. 90 tablet 0   ondansetron (ZOFRAN) 4 MG tablet Take 4 mg by mouth every 8 (eight) hours as needed. (Patient not taking: Reported on 02/03/2021)     pantoprazole (PROTONIX) 40 MG tablet Take 1 tablet by mouth every morning. (Patient not taking: Reported on 02/03/2021)     No current facility-administered medications for this visit.    Allergies:   Patient has no known allergies.    Social History:  The patient  reports that he has never smoked. He has never used smokeless tobacco. He reports current alcohol use. He reports that he does not use drugs.   Family History:  The patient's family history includes Cancer in his father.    ROS:   Noted in current history, otherwise review of systems is negative.  Physical Exam: Blood pressure 118/84, pulse 64, height '6\' 2"'$  (1.88 m), weight 262 lb (118.8 kg), SpO2 98 %.  GEN:  Well nourished,  well developed in no acute distress HEENT: Normal NECK: No JVD; No carotid bruits LYMPHATICS: No lymphadenopathy CARDIAC: irreg. Irreg.  no murmurs, rubs, gallops RESPIRATORY:  Clear to auscultation without rales, wheezing or rhonchi  ABDOMEN: Soft, non-tender, non-distended MUSCULOSKELETAL:  No edema; No deformity  SKIN: Warm and dry NEUROLOGIC:  Alert and oriented x 3   EKG:     February 03, 2021: Atrial fibrillation.  Rare premature ventricular contractions.  No changes from previous EKG.  Recent Labs: No results found for requested labs within last 8760 hours.    Lipid Panel    Component Value Date/Time   CHOL 156 05/02/2019 1017   TRIG 82 05/02/2019 1017   HDL 55 05/02/2019 1017   CHOLHDL 2.8 05/02/2019 1017   CHOLHDL 4.8 03/18/2015 0330   VLDL 21 03/18/2015 0330   LDLCALC 85 05/02/2019 1017      Wt Readings from Last 3 Encounters:  02/03/21 262 lb (118.8 kg)  11/18/19 296 lb 12 oz (134.6 kg)  05/02/19 (!) 309 lb (140.2 kg)      Other studies Reviewed: Additional studies/ records that were reviewed today include: . Review of the above records demonstrates:    ASSESSMENT AND PLAN:  1.  Mild chronic systolic congestive heart failure:  . Stable    2.   Atrial Fib:    His rate is well controlled.  Continue current medications.  2. Recurrent pulmonary emboli.      He is on Xarelto.  He will need to hold his Xarelto prior to his  upcoming bilateral hernia repair.  I would prefer that he be allowed to hold her Xarelto for 1 day prior to surgery.  If the surgeons would like some additional time, I will allow the Xarelto to be held for 2 days prior to his surgery without the need for Lovenox bridging.  He is at low risk for his upcoming surgery repair.  3. Frequent premature ventricular contractions:       4.  Pre op visit prior to hernia repair :  He is on Xarelto.  He will need to hold his Xarelto prior to his upcoming bilateral hernia repair.  I would prefer  that he be allowed to hold her Xarelto for 1 day prior to surgery.  If the surgeons would like some additional time, I will allow the Xarelto to be held for 2 days prior to his surgery without the need for Lovenox bridging.  Will see in 1 year    Current medicines are reviewed at length with the patient today.  The patient does not have concerns regarding medicines.  The following changes have been made:  no change  Labs/ tests ordered today include:   Orders Placed This Encounter  Procedures   EKG 12-Lead     Ross Moores, MD  02/03/2021 12:07 PM    [facility] [address], [place], [place]  [address] Phone: [phone]; Fax: [phone]

## 2021-02-02 NOTE — Telephone Encounter (Signed)
Pt is seeing Dr. Acie Fredrickson tomorrow. He will address clearance, [place] hold, and fax requesting office. I will remove from preop pool.

## 2021-02-03 ENCOUNTER — Ambulatory Visit (INDEPENDENT_AMBULATORY_CARE_PROVIDER_SITE_OTHER): Payer: 59 | Admitting: Cardiovascular Disease

## 2021-02-03 ENCOUNTER — Other Ambulatory Visit: Payer: Self-pay

## 2021-02-03 ENCOUNTER — Encounter: Payer: Self-pay | Admitting: Cardiovascular Disease

## 2021-02-03 VITALS — BP 118/84 | HR 64 | Ht 74.0 in | Wt 262.0 lb

## 2021-02-03 DIAGNOSIS — I2782 Chronic pulmonary embolism: Secondary | ICD-10-CM

## 2021-02-03 DIAGNOSIS — I4819 Other persistent atrial fibrillation: Secondary | ICD-10-CM | POA: Diagnosis not present

## 2021-02-03 DIAGNOSIS — Z7901 Long term (current) use of anticoagulants: Secondary | ICD-10-CM | POA: Diagnosis not present

## 2021-02-03 DIAGNOSIS — I5022 Chronic systolic (congestive) heart failure: Secondary | ICD-10-CM | POA: Diagnosis not present

## 2021-02-03 NOTE — Patient Instructions (Signed)
You are at low risk for your upcoming hernia repair. This Xarelto package insert states that we should hold the Xarelto for 1 or 2 days prior to her surgical procedure.  I would prefer that your surgery be done after holding Xarelto just 1 day.  If your surgeons would like it held longer, I would allow it being held for 2 days.  There is no reason for Xarelto to be held for longer than 2 days because it will be cleared out of your system in that time.  Please restart the Xarelto as soon as it is safe from a surgical standpoint.  I anticipate that would be 1 to 2 days following your surgical procedure.  Medication Instructions:  Your physician recommends that you continue on your current medications as directed. Please refer to the Current Medication list given to you today.  *If you need a refill on your cardiac medications before your next appointment, please call your pharmacy*   Lab Work: None Ordered If you have labs (blood work) drawn today and your tests are completely normal, you will receive your results only by: [place] (if you have MyChart) OR A paper copy in the mail If you have any lab test that is abnormal or we need to change your treatment, we will call you to review the results.   Testing/Procedures: None Ordered   Follow-Up: At [facility], you and your health needs are our priority.  As part of our continuing mission to provide you with exceptional heart care, we have created designated Provider Care Teams.  These Care Teams include your primary Cardiologist (physician) and Advanced Practice Providers (APPs -  Physician Assistants and Nurse Practitioners) who all work together to provide you with the care you need, when you need it.    Your next appointment:   1 year(s)  The format for your next appointment:   In Person  Provider:   You may see Mertie Moores, MD or one of the following Advanced Practice Providers on your designated Care Team:   Richardson Dopp, PA-C [place], [place]

## 2021-02-16 ENCOUNTER — Telehealth: Payer: Self-pay | Admitting: *Deleted

## 2021-02-16 NOTE — Telephone Encounter (Signed)
[facility] with [facility] [place] pre service testing called the office today and asked if we could please fax over most recent EKG for pt's upcoming procedure tomorrow. EKG has been faxed to [facility] at [phone].

## 2021-03-13 ENCOUNTER — Other Ambulatory Visit: Payer: Self-pay

## 2021-03-13 ENCOUNTER — Other Ambulatory Visit: Payer: Self-pay | Admitting: Cardiovascular Disease

## 2021-03-15 NOTE — Telephone Encounter (Signed)
Xarelto 20 mg refill request received. Pt is 65 years old, weight- 118.8 kg, Crea-  0.9 on 01/04/21, last seen by Dr. Acie Fredrickson on 02/03/21, Diagnosis-afib, CrCl- 139.33; Dose is appropriate based on dosing criteria. Will send in refill to requested pharmacy.

## 2021-04-13 ENCOUNTER — Ambulatory Visit: Payer: No Typology Code available for payment source | Admitting: Cardiovascular Disease

## 2021-06-27 ENCOUNTER — Other Ambulatory Visit: Payer: Self-pay | Admitting: Cardiovascular Disease

## 2021-06-28 NOTE — Telephone Encounter (Signed)
Prescription refill request for Xarelto received.  Indication: PE Last office visit: 02/03/21 ([facility]ser)  Weight: 118.8kg Age: 66 Scr: 0.9 (01/04/21)  CrCl: 139.73ml/min  Appropriate dose and refill sent to requested pharmacy.

## 2021-06-30 ENCOUNTER — Telehealth: Payer: Self-pay | Admitting: *Deleted

## 2021-06-30 NOTE — Telephone Encounter (Signed)
° °  Pre-operative Risk Assessment    Patient Name: Ross [name]  DOB: 10/31/1955 MRN: [id number]      Request for Surgical Clearance    Procedure:   VITRECTOMY, ENDOLASER RIGHT EYE  Date of Surgery:  Clearance 07/12/21                                 Surgeon:  DR. Robinette Haines Surgeon's Group or Practice Name:  [facility] Phone number:  [phone] Fax number:  [phone]   Type of Clearance Requested:   - Medical  - Pharmacy:  Hold Rivaroxaban (Xarelto) 2-3 DAYS PRIOR TO SURGERY   Type of Anesthesia:  Local  WITH LIGHT SEDATION   Additional requests/questions:   CLEARANCE IS NOTED TO SEND RELEVANT TESTING, EKG, ECHO  Signed, Julaine Hua   06/30/2021, 3:36 PM

## 2021-07-01 NOTE — Telephone Encounter (Signed)
° °  Name: Ross [name]  DOB: [birth year]  MRN: [id number]   Primary Cardiologist: Mertie Moores, MD  Chart reviewed as part of pre-operative protocol coverage. Patient was contacted 07/01/2021 in reference to pre-operative risk assessment for pending surgery as outlined below.  JONATHIN HEINICKE was last seen on 02/03/2021 by Dr. Acie Fredrickson.  Since that day, RUBE SANCHEZ has done well without chest pain or worsening dyspnea.   Therefore, based on ACC/AHA guidelines, the patient would be at acceptable risk for the planned procedure without further cardiovascular testing.   Patient may hold Xarelto for 1-2 days prior to the procedure and restart as soon as possible afterward at the surgeon's discretion.   The patient was advised that if he develops new symptoms prior to surgery to contact our office to arrange for a follow-up visit, and he verbalized understanding.  I will route this recommendation to the requesting party via Epic fax function and remove from pre-op pool. Please call with questions.  [place], [place] 07/01/2021, 5:01 PM

## 2021-07-01 NOTE — Telephone Encounter (Signed)
Patient with diagnosis of recurrent DVT/PE and afib on Xarelto for anticoagulation.    Procedure: VITRECTOMY, ENDOLASER RIGHT EYE Date of procedure: 07/12/21  CHA2DS2-VASc Score = 2  This indicates a 2.2% annual risk of stroke. The patient's score is based upon: CHF History: 1 HTN History: 0 Diabetes History: 0 Stroke History: 0 Vascular Disease History: 1 Age Score: 0 Gender Score: 0   CrCl >168mL/min Platelet count 96K  Would recommend holding Xarelto for 1 day prior to procedure if possible given history of recurrent VTE. If longer hold is required, Dr Acie Fredrickson did mention being ok with up to 2 day Xarelto hold for a prior more invasive surgery.

## 2021-07-01 NOTE — Telephone Encounter (Signed)
Unclear if vitrectomy surgery necessarily require holding blood thinner, will ask our clinical pharmacist to review.  He has both A. fib and recurrent PE.

## 2021-09-08 ENCOUNTER — Other Ambulatory Visit: Payer: Self-pay | Admitting: Cardiovascular Disease

## 2021-09-08 NOTE — Telephone Encounter (Signed)
Prescription refill request for Xarelto received.  ? ?Indication: DVT. Afib  ?Last office visit: [facility], 02/03/2021 ?Weight: 118.8 kg  ?Age: 66 yo  ?Scr: 0.8, 07/05/2021 ?CrCl: 156 ml/min  ? ? Refill sent.  ?

## 2022-02-03 ENCOUNTER — Ambulatory Visit: Payer: Medicare Other | Attending: Cardiovascular Disease | Admitting: Cardiovascular Disease

## 2022-02-03 ENCOUNTER — Encounter: Payer: Self-pay | Admitting: Cardiovascular Disease

## 2022-02-03 VITALS — BP 138/90 | HR 82 | Ht 73.0 in | Wt 278.2 lb

## 2022-02-03 DIAGNOSIS — I4811 Longstanding persistent atrial fibrillation: Secondary | ICD-10-CM | POA: Diagnosis present

## 2022-02-03 DIAGNOSIS — I5042 Chronic combined systolic (congestive) and diastolic (congestive) heart failure: Secondary | ICD-10-CM | POA: Insufficient documentation

## 2022-02-03 DIAGNOSIS — I2699 Other pulmonary embolism without acute cor pulmonale: Secondary | ICD-10-CM | POA: Diagnosis present

## 2022-02-03 NOTE — Progress Notes (Signed)
Cardiology Office Note   Date:  02/03/2022   ID:  Ross [name], DOB 29-Dec-1955, MRN [id number]  PCP:  Pcp, No  Cardiologist:   Ross Moores, MD   Chief Complaint  Patient presents with   Congestive Heart Failure        Atrial Fibrillation        Problem List 1. Recurrent Pulmonary Emboli 2. Chronic systolic CHF 3. PVCs   4.  Chronic lymphocytic lymphoma 5.  Atrial Fib:    Nov. 20, 2016:    Ross [name] is a 66 y.o. male who presents for follow up of his recent hospitalization for recurrent pulmonary emboli .   Was found to have mildly decreased LV systolic function .   Was discharged with Lovenox for 1 month.  Now is on Xarelto 15 mg BID and is scheduled to change to Xarelto 20 mg a day following 21 days of the 15 BID dosing .  Marland Kitchen  This recommendation was given by hematology .   Has seen by Ross [name].    Has done well since his DC.  No CP .  Breathing was ok  Feb. 27, 2017  Ross [name] is seen for follow up for his CHF and pulmonary emboli.   Was seen with his wife , Ross [name].    They have a house up in [place], [place].   He went to [place].  [place].  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  His Breathing is good. Walks and does yard work occasionally .  No Cp , no dyspnea  Oct 27, 2015  Ross [name] is seen today for follow up of his chronic systolic CHF - EF 88%.  Also has hx of pulmonary embolus. He and Ross [name] are moving to [place]. Able to do lots of work without issues.   No CP or dyspnea   Oct. 23, 2017:  Ross [name] is seen today for follow up of his CHF and pulmonary embolus  Still splitting time between here and [place] BP and HR are well controlled.   Getting some exercise - walking 3-5 miles a day .   Walks the roads  Has frequent PVCs on ECG He cannot feel them   06/20/2016: Ross [name] is seen for follow-up of his congestive heart failure. He has frequent premature  ventricular contractions. A Holter monitor revealed significant ventricular ectopy.   He had a PVC burden of 22%.    He was seen by electrophysiology. Because of his multiple PVC morphologies he was not a good candidate for catheter ablation. Ross [name] recommended a period of watchful waiting. They discussed antiarrhythmic therapy but concluded that there was no absolute indication for treatment/suppression at this time. Ross [name] recommended yearly echoes.  Getting a little exercise.   Is moving to a condo. No CP or dyspnea.     Sept. 10, 2018:  Ross [name] is doing well Has been having night sweats,   Has been started on Ibrutinib.  Stable from a cardiac standpoint. Walks 3-4 miles a day  Walks up in [place] on campus . Just started golfing   September 11, 2017: Doing well from a cardiac standpoint.   Is on Ibrutinib 140 mg a day ( caused him to bleed easily  With this along with the Xarelto )  Needs to have Mohs surgery on a basal cell cencer  Breathing is going well. Walks 2-3 miles a day .  Dec. 16, 2019: Ross [name] is seen for follow up for his CHF, frequent PVCs, and PAF  Recent monitor reveals only 2% PVC burden.  His previous monitor revealed 22% PVC burden. He had an echo in June, 2019  Shows normal EF of 60-65%.   Exercising for the most part   Is having some bleeding in his right eye.   Had a hypotensive episode.  He has been losing weight and has been eating less.  The episode of hypotension prevented him from having an eye injection.  His eye surgeon needs clearance to have his eye injected in the next several weeks.  May 02, 2019:    Ross [name]  Is seen today for follow-up of his congestive heart failure, atrial fibrillation, frequent premature ventricular contractions. Wt today is 95     November 18, 2019:  Ross [name] is seen today for follow-up visit.  His weight is down 13 pounds from his last visit.  Weight today is 296 pounds.  Working out daily.   No problems.    Has eye issues.   -   Branch retinal vein occlusion with macular edema ,  Getting injections into his right eye    Sept. 7, 2022: Ross [name] is seen for follow up and for pre - op evaluation prior to hernia surgery . He is on Xarelto for A-fib and hx of PE  Hx of CLL   Has bought an ebike - rides 100 + miles a week.  Rides the [facility] trail.  Has lost 40 lbs.   Riding more.   Eating better .   No CP , no dyspnea.   I discussed with our pharmacy team.  We all agree that it would be acceptable for him to hold his Xarelto 1 day prior to his hernia surgery. If the surgeons think they needed help for longer, I would be okay with him holding it for 2 days.  I do not see any reason to bridge him with Lovenox given the very brief holding time of the Xarelto.   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  Gained a little weight over the year  Walks the [facility] trail regularly   Past Medical History:  Diagnosis Date   Acute meniscal tear of knee LEFT   Cardiomyopathy ([place])    a. Dx 10/16: EF 40-45 // Nuc stress 10/16: Low risk; small, severe, fixed apical defect c/w apical thinning; no ischemia; EF 45 with global hypokinesis; moderate LVE. // Echo 11/17: Mod LVH, EF 50-55 ant-sept HK, Gr 1 DD, aortic root 40 mm, mildly dilated asc aorta, mild MVP, trivial MR, mod LAE, mild RAE, trivial TR // Echo 6/19 [facility]):  EF 60-65, mild MR mod MVP    Chronic systolic CHF (congestive heart failure) ([place]) 04/20/2015   CLL (chronic lymphocytic leukemia) ([place]) 03/16/2015   a. CLL/SLL followed at [facility].   DVT of leg (deep venous thrombosis) ([facility])    Habitual alcohol use    PE (pulmonary embolism)    a. Multiple bilateral PEs on CT 07/2014. Initially tx with Lovenox/Xarelto x 6 months, but readmitted 02/2015 with recurrent acute on  chronic PE.- Lifelong anticoag    PVC's (premature ventricular contractions)    a. Frequent, noted on telemetry during 02/2015 admisson - also seen on EKG in  07/2014. // Holter 2017 - 22% PVC burden - eval by EP >> multiple morphologies; not a candidate for ablation // needs yearly echo to assess for LV dysfunction     Past Surgical History:  Procedure Laterality Date   FATTY CYST REMOVED FROM FLANK AND BACK     FLANK- 15 YRS AGO/  BACK 12 YRS AGO   KNEE ARTHROSCOPY  07/22/2011   Procedure: ARTHROSCOPY KNEE;  Surgeon: Magnus Sinning, MD;  Location: [facility];  Service: Orthopedics;  Laterality: Left;  Partial medial menisectomy and shaving of the patella   LEFT INGUINAL HERNIA REPAIR W/ MESH  06-10-2005   TONSILLECTOMY       Current Outpatient Medications  Medication Sig Dispense Refill   Cholecalciferol (VITAMIN D-1000 MAX ST) 25 MCG (1000 UT) tablet Take by mouth. Take by mouth.     ibrutinib (IMBRUVICA) 140 MG capsul Take 3 capsules by mouth daily. Start 02/13/17     losartan (COZAAR) 25 MG tablet Take 1 tablet (25 mg total) by mouth daily. 90 tablet 3   metoprolol succinate (TOPROL-XL) 50 MG 24 hr tablet Take 1 tablet (50 mg total) by mouth daily. 90 tablet 3   Ranibizumab (LUCENTIS IO) Inject into the eye. Eye injection every 8-10 weeks     rivaroxaban (XARELTO) 20 MG TABS tablet TAKE 1 TABLET BY MOUTH DAILY WITH SUPPER 90 tablet 1   No current facility-administered medications for this visit.    Allergies:   Patient has no known allergies.    Social History:  The patient  reports that he has never smoked. He has never used smokeless tobacco. He reports current alcohol use. He reports that he does not use drugs.   Family History:  The patient's family history includes Cancer in his father.    ROS:   Noted in current history, otherwise review of systems is negative.  Physical Exam: Blood pressure (!) 138/90, pulse 82, height '6\' 1"'$  (1.854 m), weight 278 lb  3.2 oz (126.2 kg), SpO2 94 %.    GEN:  moderately obese male,   in no acute distress HEENT: Normal NECK: No JVD; No carotid bruits LYMPHATICS: No lymphadenopathy CARDIAC: Irreg. Irreg.  soft systolic , radiates to the L ax line ( c/w  MR )   RESPIRATORY:  Clear to auscultation without rales, wheezing or rhonchi  ABDOMEN: Soft, non-tender, non-distended MUSCULOSKELETAL:  No edema; No deformity  SKIN: Warm and dry NEUROLOGIC:  Alert and oriented x 3   EKG:       Afib with HR of 82   Recent Labs: No results found for requested labs within last 365 days.    Lipid Panel    Component Value Date/Time   CHOL 156 05/02/2019 1017   TRIG 82 05/02/2019 1017   HDL 55 05/02/2019 1017   CHOLHDL 2.8 05/02/2019 1017   CHOLHDL 4.8 03/18/2015 0330   VLDL 21 03/18/2015 0330   LDLCALC 85 05/02/2019 1017      Wt Readings from Last 3 Encounters:  02/03/22 278 lb 3.2 oz (126.2 kg)  02/03/21 262 lb (118.8 kg)  11/18/19 296 lb 12 oz (134.6 kg)      Other studies Reviewed: Additional studies/ records that were reviewed today include: . Review of the above records demonstrates:    ASSESSMENT AND PLAN:  1.  Mild chronic systolic congestive heart failure:  . Stable    On Losartan  I would like to switch him to [facility] .  He has  moved to [place] and will be transitioning his care up to [facility].  He will be looking for new cardiologist.      2.   Atrial Fib:       2. Recurrent pulmonary emboli.           3. Frequent premature ventricular contractions:           Current medicines are reviewed at length with the patient today.  The patient does not have concerns regarding medicines.  The following changes have been made:  no change  Labs/ tests ordered today include:   Orders Placed This Encounter  Procedures   EKG 12-Lead     Ross Moores, MD  02/03/2022 6:28 PM    [address], [place], [place]  [address] Phone:  [phone]; Fax: [phone]

## 2022-02-03 NOTE — Patient Instructions (Addendum)
Ross [name] is currently on losartan.  He does have mildly reduced LVEF of 40 to 45%.  I would recommend transitioning him to Entresto (instead of Losartan)   He also has mitral regurgitation which  sounds like that might have worsened .  I recommend repeat echocardiogram.  He had only mild mitral regurgitation by echo in 2016 He may need a TEE at some point soon   Needs to avoid salt Exercise more   Medication Instructions:  Your physician recommends that you continue on your current medications as directed. Please refer to the Current Medication list given to you today.  *If you need a refill on your cardiac medications before your next appointment, please call your pharmacy*   Lab Work: NONE If you have labs (blood work) drawn today and your tests are completely normal, you will receive your results only by: [place] (if you have MyChart) OR A paper copy in the mail If you have any lab test that is abnormal or we need to change your treatment, we will call you to review the results.   Testing/Procedures: NONE   Follow-Up: At [facility], you and your health needs are our priority.  As part of our continuing mission to provide you with exceptional heart care, we have created designated Provider Care Teams.  These Care Teams include your primary Cardiologist (physician) and Advanced Practice Providers (APPs -  Physician Assistants and Nurse Practitioners) who all work together to provide you with the care you need, when you need it.  Your next appointment:   As Needed  The format for your next appointment:   In Person  Provider:   Mertie Moores, MD       Important Information About Sugar

## 2022-03-03 ENCOUNTER — Other Ambulatory Visit: Payer: Self-pay | Admitting: Cardiovascular Disease

## 2023-03-09 ENCOUNTER — Other Ambulatory Visit: Payer: Self-pay

## 2023-03-09 MED ORDER — METOPROLOL SUCCINATE ER 50 MG PO TB24: 50.0000 mg | ORAL_TABLET | Freq: Every day | ORAL | 0 refills | Status: AC
